# Patient Record
Sex: Female | Born: 1985
Health system: Southern US, Community
[De-identification: ages and names within clinical notes are randomized; demographics above are authoritative.]

## PROBLEM LIST (undated history)

## (undated) DIAGNOSIS — N390 Urinary tract infection, site not specified: Secondary | ICD-10-CM

## (undated) DIAGNOSIS — J45909 Unspecified asthma, uncomplicated: Secondary | ICD-10-CM

## (undated) DIAGNOSIS — E739 Lactose intolerance, unspecified: Secondary | ICD-10-CM

## (undated) HISTORY — DX: Urinary tract infection, site not specified: N39.0

## (undated) HISTORY — DX: Unspecified asthma, uncomplicated: J45.909

## (undated) HISTORY — PX: WISDOM TOOTH EXTRACTION: SHX21

## (undated) HISTORY — DX: Lactose intolerance, unspecified: E73.9

---

## 2012-10-09 LAB — HM PAP SMEAR: HM Pap smear: NORMAL

## 2013-02-23 ENCOUNTER — Ambulatory Visit (INDEPENDENT_AMBULATORY_CARE_PROVIDER_SITE_OTHER): Payer: 59 | Admitting: Family Medicine

## 2013-02-23 VITALS — BP 102/62 | HR 60 | Temp 98.1°F | Resp 18 | Ht 66.0 in | Wt 125.6 lb

## 2013-02-23 DIAGNOSIS — N760 Acute vaginitis: Secondary | ICD-10-CM

## 2013-02-23 DIAGNOSIS — L709 Acne, unspecified: Secondary | ICD-10-CM

## 2013-02-23 DIAGNOSIS — H6092 Unspecified otitis externa, left ear: Secondary | ICD-10-CM

## 2013-02-23 DIAGNOSIS — H60399 Other infective otitis externa, unspecified ear: Secondary | ICD-10-CM

## 2013-02-23 DIAGNOSIS — B9689 Other specified bacterial agents as the cause of diseases classified elsewhere: Secondary | ICD-10-CM

## 2013-02-23 DIAGNOSIS — L708 Other acne: Secondary | ICD-10-CM

## 2013-02-23 MED ORDER — NEOMYCIN-POLYMYXIN-HC 3.5-10000-1 OT SOLN: 3.0000 [drp] | Freq: Four times a day (QID) | OTIC | Status: DC

## 2013-02-23 MED ORDER — ADAPALENE-BENZOYL PEROXIDE 0.1-2.5 % EX GEL: 1.0000 [drp] | Freq: Every day | CUTANEOUS | Status: DC

## 2013-02-23 MED ORDER — METRONIDAZOLE 500 MG PO TABS: 500.0000 mg | ORAL_TABLET | Freq: Two times a day (BID) | ORAL | Status: DC

## 2013-02-23 NOTE — Patient Instructions (Signed)
Use the Cortisporin in your Right ear 3-4 times a day for the next 7 days.  Use the Epiduo daily.   Take the Flagyl twice daily x 7 days.  Let me know if you're not improving with this.   It was good to meet you today!

## 2013-02-23 NOTE — Progress Notes (Signed)
Stacy Saunders is a 27 y.o. female who presents to Urgent Care today with several concerns:  1.  Concern for ear infection:  Fullness and swelling in Right ear x 3 days.  Sounds "stuffy" on that side.  Pain with chewing or talking.  No fevers or chills.  No Q-tips, ear buds/plugs.  Is a nurse and wears stethoscope.  Feels like ear is "swollen."  2.  Acne:  Has been controlled on Epiduo in past.  Present since puberty.  Has been using this nightly for years.  Would like refill.  No acute worsening recently.    3.  Vaginal itching:  Present x 5 days .  Describes itching and burning.  Has history of recurrent bacterial vaginosis, last was 2 months ago.  Steady boyfriend for past several years, monogamous.  Has had yeast infection before in past but this feels like her usual BV.  Thin grayish discharge.   PMH reviewed.  History reviewed. No pertinent past medical history. History reviewed. No pertinent past surgical history.  Medications reviewed. Current Outpatient Prescriptions  Medication Sig Dispense Refill  . Multiple Vitamin (MULTIVITAMIN) tablet Take 1 tablet by mouth daily.       No current facility-administered medications for this visit.    ROS as above otherwise neg.  No chest pain, palpitations, SOB, Fever, Chills, Abd pain, N/V/D.   Physical Exam:  BP 102/62  Pulse 60  Temp(Src) 98.1 F (36.7 C) (Oral)  Resp 18  Ht 5\' 6"  (1.676 m)  Wt 125 lb 9.6 oz (56.972 kg)  BMI 20.28 kg/m2  SpO2 100%  LMP 02/09/2013 Gen:  Alert, cooperative patient who appears stated age in no acute distress.  Vital signs reviewed. Head:  [place]/AT.   Eyes:  PERRL Ears:  Left ear WNL, TM pearly gray.  Right ear with thick discharge in canal, somewhat purulent appearing.  Pain with pulling on tragus.  I cannot visualize TM due to discharge in canal.   Mouth:  MMM, no oropharyngeal erythema.   Neck:  No LAD Gyn:  Patient declines GU exam Skin:  Several scattered closed comedones across bridge of nose and  cheeks.  No others noted Ext:  No LE edema.  Neuro:  No gross deficits noted thoughout Psych:  Pleasant, conversant  Assessment and Plan:  1.  Otitis externa: - cortipsorin to treat - RTC in 2 weeks if no improvement.   - To clean stethoscope buds in between usage with alcohol swabs.   2.  Acne:   - refill for Epiduo  3.  BV: - patient with multiple recurrent episodes of BV - refused pelvic exam - no red flags; can do trial of Flagyl and gauge for improvement.   - to call if worsening.

## 2013-10-08 ENCOUNTER — Telehealth: Payer: Self-pay

## 2013-10-08 NOTE — Telephone Encounter (Signed)
Left message for call back Identifiable   New patient 

## 2013-10-09 ENCOUNTER — Other Ambulatory Visit (HOSPITAL_COMMUNITY)
Admission: RE | Admit: 2013-10-09 | Discharge: 2013-10-09 | Disposition: A | Discharge status: Home / Self Care | Payer: 59 | Source: Ambulatory Visit | Attending: Family Medicine | Admitting: Family Medicine

## 2013-10-09 ENCOUNTER — Ambulatory Visit (INDEPENDENT_AMBULATORY_CARE_PROVIDER_SITE_OTHER): Payer: 59 | Admitting: Family Medicine

## 2013-10-09 ENCOUNTER — Other Ambulatory Visit: Payer: Self-pay | Admitting: Family Medicine

## 2013-10-09 ENCOUNTER — Encounter: Payer: Self-pay | Admitting: Family Medicine

## 2013-10-09 VITALS — BP 106/78 | HR 60 | Temp 98.7°F | Resp 16 | Ht 67.0 in | Wt 134.1 lb

## 2013-10-09 DIAGNOSIS — N898 Other specified noninflammatory disorders of vagina: Secondary | ICD-10-CM | POA: Insufficient documentation

## 2013-10-09 DIAGNOSIS — Z0000 Encounter for general adult medical examination without abnormal findings: Secondary | ICD-10-CM

## 2013-10-09 DIAGNOSIS — N760 Acute vaginitis: Secondary | ICD-10-CM | POA: Insufficient documentation

## 2013-10-09 DIAGNOSIS — J45990 Exercise induced bronchospasm: Secondary | ICD-10-CM

## 2013-10-09 DIAGNOSIS — Z124 Encounter for screening for malignant neoplasm of cervix: Secondary | ICD-10-CM

## 2013-10-09 MED ORDER — ALBUTEROL SULFATE HFA 108 (90 BASE) MCG/ACT IN AERS: 2.0000 | INHALATION_SPRAY | RESPIRATORY_TRACT | Status: DC | PRN

## 2013-10-09 NOTE — Patient Instructions (Signed)
Follow up in 1 year or as needed We'll notify you of your lab results and make any changes if needed We'll call you with your GYN appt to discuss the Implanon and get your pap Call with any questions or concerns Welcome!  We're glad to have you!

## 2013-10-09 NOTE — Assessment & Plan Note (Signed)
Pt's PE WNL w/ exception of vaginal d/c.  Check labs.  Anticipatory guidance provided. Refer to GYN at pt's request for possible implanon insertion and pap.

## 2013-10-09 NOTE — Progress Notes (Signed)
Pre visit review using our clinic review tool, if applicable. No additional management support is needed unless otherwise documented below in the visit note. 

## 2013-10-09 NOTE — Assessment & Plan Note (Signed)
New.  Wet prep collected in order to appropriate direct treatment.

## 2013-10-09 NOTE — Progress Notes (Signed)
   Subjective:    Patient ID: Stacy Saunders, female    DOB: [birth year], 28 y.o.   MRN: [id number]  HPI New to establish.  Previous MD- [facility])  Vaginal d/c- pt has hx of similar, sxs started 'a couple of weeks ago'.  Mild itching, + vaginal discharge- thick and clumpy.  No odor   Review of Systems Patient reports no vision/ hearing changes, adenopathy,fever, weight change,  persistant/recurrent hoarseness , swallowing issues, chest pain, palpitations, edema, persistant/recurrent cough, hemoptysis, dyspnea (rest/exertional/paroxysmal nocturnal), gastrointestinal bleeding (melena, rectal bleeding), abdominal pain, significant heartburn, bowel changes, GU symptoms (dysuria, hematuria, incontinence), Gyn symptoms (abnormal  bleeding, pain),  syncope, focal weakness, memory loss, numbness & tingling, skin/hair/nail changes, abnormal bruising or bleeding, anxiety, or depression.     Objective:   Physical Exam General Appearance:    Alert, cooperative, no distress, appears stated age  Head:    Normocephalic, without obvious abnormality, atraumatic  Eyes:    PERRL, conjunctiva/corneas clear, EOM's intact, fundi    benign, both eyes  Ears:    Normal TM's and external ear canals, both ears  Nose:   Nares normal, septum midline, mucosa normal, no drainage    or sinus tenderness  Throat:   Lips, mucosa, and tongue normal; teeth and gums normal  Neck:   Supple, symmetrical, trachea midline, no adenopathy;    Thyroid: no enlargement/tenderness/nodules  Back:     Symmetric, no curvature, ROM normal, no CVA tenderness  Lungs:     Clear to auscultation bilaterally, respirations unlabored  Chest Wall:    No tenderness or deformity   Heart:    Regular rate and rhythm, S1 and S2 normal, no murmur, rub   or gallop  Breast Exam:    Deferred to GYN  Abdomen:     Soft, non-tender, bowel sounds active all four quadrants,    no masses, no organomegaly  Genitalia:    Thick, white,  clumpy vaginal discharge visible externally, wet prep collected  Rectal:    Extremities:   Extremities normal, atraumatic, no cyanosis or edema  Pulses:   2+ and symmetric all extremities  Skin:   Skin color, texture, turgor normal, no rashes or lesions  Lymph nodes:   Cervical, supraclavicular, and axillary nodes normal  Neurologic:   CNII-XII intact, normal strength, sensation and reflexes    throughout          Assessment & Plan:

## 2013-10-09 NOTE — Assessment & Plan Note (Signed)
New to provider.  Prescription for Proair given to have on hand in case pt needs this.

## 2013-10-10 LAB — VITAMIN D 25 HYDROXY (VIT D DEFICIENCY, FRACTURES): Vit D, 25-Hydroxy: 25 ng/mL — ABNORMAL LOW (ref 30–89)

## 2013-10-12 ENCOUNTER — Encounter: Payer: Self-pay | Admitting: General Practice

## 2013-10-12 NOTE — Telephone Encounter (Signed)
Unable to reach pre visit.  

## 2013-10-13 ENCOUNTER — Other Ambulatory Visit: Payer: Self-pay | Admitting: General Practice

## 2013-10-13 MED ORDER — METRONIDAZOLE 500 MG PO TABS: 500.0000 mg | ORAL_TABLET | Freq: Two times a day (BID) | ORAL | Status: DC

## 2013-10-14 ENCOUNTER — Encounter: Payer: Self-pay | Admitting: General Practice

## 2013-10-14 LAB — LIPID PANEL
CHOLESTEROL: 144 mg/dL (ref 0–200)
HDL: 67 mg/dL (ref >39)
LDL Cholesterol: 66 mg/dL (ref 0–99)
Total CHOL/HDL Ratio: 2.1 Ratio
Triglycerides: 54 mg/dL (ref <150)
VLDL: 11 mg/dL (ref 0–40)

## 2013-10-14 LAB — BASIC METABOLIC PANEL
BUN: 9 mg/dL (ref 6–23)
CALCIUM: 9.8 mg/dL (ref 8.4–10.5)
CO2: 24 mEq/L (ref 19–32)
Chloride: 105 mEq/L (ref 96–112)
Creat: 0.87 mg/dL (ref 0.50–1.10)
GLUCOSE: 66 mg/dL — AB (ref 70–99)
Potassium: 4.2 mEq/L (ref 3.5–5.3)
Sodium: 139 mEq/L (ref 135–145)

## 2013-10-14 LAB — HEPATIC FUNCTION PANEL
ALT: 13 U/L (ref 0–35)
AST: 16 U/L (ref 0–37)
Albumin: 4.1 g/dL (ref 3.5–5.2)
Alkaline Phosphatase: 44 U/L (ref 39–117)
BILIRUBIN DIRECT: 0.1 mg/dL (ref 0.0–0.3)
BILIRUBIN INDIRECT: 0.2 mg/dL (ref 0.2–1.2)
Total Bilirubin: 0.3 mg/dL (ref 0.2–1.2)
Total Protein: 6.7 g/dL (ref 6.0–8.3)

## 2013-10-14 LAB — TSH: TSH: 2.832 u[IU]/mL (ref 0.350–4.500)

## 2014-06-10 ENCOUNTER — Encounter (HOSPITAL_COMMUNITY): Payer: Self-pay | Admitting: Emergency Medicine

## 2014-06-10 ENCOUNTER — Emergency Department (HOSPITAL_COMMUNITY)
Admission: EM | Admit: 2014-06-10 | Discharge: 2014-06-10 | Disposition: A | Discharge status: Home / Self Care | Payer: 59 | Source: Home / Self Care | Attending: Family Medicine | Admitting: Family Medicine

## 2014-06-10 ENCOUNTER — Telehealth: Payer: Self-pay | Admitting: *Deleted

## 2014-06-10 ENCOUNTER — Ambulatory Visit: Payer: 59 | Admitting: Family Medicine

## 2014-06-10 DIAGNOSIS — J069 Acute upper respiratory infection, unspecified: Secondary | ICD-10-CM

## 2014-06-10 DIAGNOSIS — J029 Acute pharyngitis, unspecified: Secondary | ICD-10-CM

## 2014-06-10 LAB — POCT RAPID STREP A: Streptococcus, Group A Screen (Direct): NEGATIVE

## 2014-06-10 MED ORDER — ACETAMINOPHEN 325 MG PO TABS
ORAL_TABLET | ORAL | Status: AC
  Filled 2014-06-10: qty 2

## 2014-06-10 MED ORDER — ACETAMINOPHEN 325 MG PO TABS
650.0000 mg | ORAL_TABLET | Freq: Once | ORAL | Status: AC
  Administered 2014-06-10: 650 mg via ORAL

## 2014-06-10 NOTE — Discharge Instructions (Signed)
Rapid strep test was negative. Exam suggestions a common cold with associated viral pharyngitis (sore throat). Warm salt water gargles and tylenol as directed on packaging for discomfort. Throat swab will be sent for 3 day culture and if results indicate the need for additional treatment you will be notified by phone. Expect improvement over the next 5-6 days.  Pharyngitis Pharyngitis is redness, pain, and swelling (inflammation) of your pharynx.  CAUSES  Pharyngitis is usually caused by infection. Most of the time, these infections are from viruses (viral) and are part of a cold. However, sometimes pharyngitis is caused by bacteria (bacterial). Pharyngitis can also be caused by allergies. Viral pharyngitis may be spread from person to person by coughing, sneezing, and personal items or utensils (cups, forks, spoons, toothbrushes). Bacterial pharyngitis may be spread from person to person by more intimate contact, such as kissing.  SIGNS AND SYMPTOMS  Symptoms of pharyngitis include:   Sore throat.   Tiredness (fatigue).   Low-grade fever.   Headache.  Joint pain and muscle aches.  Skin rashes.  Swollen lymph nodes.  Plaque-like film on throat or tonsils (often seen with bacterial pharyngitis). DIAGNOSIS  Your health care provider will ask you questions about your illness and your symptoms. Your medical history, along with a physical exam, is often all that is needed to diagnose pharyngitis. Sometimes, a rapid strep test is done. Other lab tests may also be done, depending on the suspected cause.  TREATMENT  Viral pharyngitis will usually get better in 3-4 days without the use of medicine. Bacterial pharyngitis is treated with medicines that kill germs (antibiotics).  HOME CARE INSTRUCTIONS   Drink enough water and fluids to keep your urine clear or pale yellow.   Only take over-the-counter or prescription medicines as directed by your health care provider:   If you are  prescribed antibiotics, make sure you finish them even if you start to feel better.   Do not take aspirin.   Get lots of rest.   Gargle with 8 oz of salt water ( tsp of salt per 1 qt of water) as often as every 1-2 hours to soothe your throat.   Throat lozenges (if you are not at risk for choking) or sprays may be used to soothe your throat. SEEK MEDICAL CARE IF:   You have large, tender lumps in your neck.  You have a rash.  You cough up green, yellow-brown, or bloody spit. SEEK IMMEDIATE MEDICAL CARE IF:   Your neck becomes stiff.  You drool or are unable to swallow liquids.  You vomit or are unable to keep medicines or liquids down.  You have severe pain that does not go away with the use of recommended medicines.  You have trouble breathing (not caused by a stuffy nose). MAKE SURE YOU:   Understand these instructions.  Will watch your condition.  Will get help right away if you are not doing well or get worse. Document Released: 04/30/2005 Document Revised: 02/18/2013 Document Reviewed: 01/05/2013 [employer] Patient Information 2015 [place], [place]. This information is not intended to replace advice given to you by your health care provider. Make sure you discuss any questions you have with your health care provider.  Salt Water Gargle This solution will help make your mouth and throat feel better. HOME CARE INSTRUCTIONS   Mix 1 teaspoon of salt in 8 ounces of warm water.  Gargle with this solution as much or often as you need or as directed. Swish and gargle gently  if you have any sores or wounds in your mouth.  Do not swallow this mixture. Document Released: 02/02/2004 Document Revised: 07/23/2011 Document Reviewed: 06/25/2008 [facility] Patient Information 2015 [place], [place]. This information is not intended to replace advice given to you by your health care provider. Make sure you discuss any questions you have with your health care provider.  Strep Throat  Tests While most sore throats are caused by viruses, at times they are caused by a bacteria called group A Streptococci (strep throat). It is important to determine the cause because the strep bacteria is treated with antibiotic medication. There are 2 types of tests for strep throat: a rapid strep test and a throat culture. Both tests are done by wiping a swab over the back of the throat and then using chemicals to identify the type of bacteria present. The rapid strep test takes 10 to 20 minutes. If the rapid strep test is negative, a throat culture may be performed to confirm the results. With a throat culture, the swab is used to spread the bacteria on a gel plate and grow it in a lab, which may take 1 to 2 days. In some cases, the culture will detect strep bacteria not found with the rapid strep test. If the result of the rapid strep test is positive, no further testing is needed, and your caregiver will prescribe antibiotics. Not all test results are available during your visit. If your test results are not back during the visit, make an appointment with your caregiver to find out the results. Do not assume everything is normal if you have not heard from your caregiver or the medical facility. It is important for you to follow up on all of your test results. SEEK MEDICAL CARE IF:   Your symptoms are not improving within 1 to 2 days, or you are getting worse.  You have any other questions or concerns. SEEK IMMEDIATE MEDICAL CARE IF:   You have increased difficulty with swallowing.  You develop trouble breathing.  You have a fever. Document Released: 06/07/2004 Document Revised: 07/23/2011 Document Reviewed: 08/05/2013 [facility] Patient Information 2015 [place], [place]. This information is not intended to replace advice given to you by your health care provider. Make sure you discuss any questions you have with your health care provider.  Sore Throat A sore throat is pain, burning, irritation,  or scratchiness of the throat. There is often pain or tenderness when swallowing or talking. A sore throat may be accompanied by other symptoms, such as coughing, sneezing, fever, and swollen neck glands. A sore throat is often the first sign of another sickness, such as a cold, flu, strep throat, or mononucleosis (commonly known as mono). Most sore throats go away without medical treatment. CAUSES  The most common causes of a sore throat include:  A viral infection, such as a cold, flu, or mono.  A bacterial infection, such as strep throat, tonsillitis, or whooping cough.  Seasonal allergies.  Dryness in the air.  Irritants, such as smoke or pollution.  Gastroesophageal reflux disease (GERD). HOME CARE INSTRUCTIONS   Only take over-the-counter medicines as directed by your caregiver.  Drink enough fluids to keep your urine clear or pale yellow.  Rest as needed.  Try using throat sprays, lozenges, or sucking on hard candy to ease any pain (if older than 4 years or as directed).  Sip warm liquids, such as broth, herbal tea, or warm water with honey to relieve pain temporarily. You may also eat  or drink cold or frozen liquids such as frozen ice pops.  Gargle with salt water (mix 1 tsp salt with 8 oz of water).  Do not smoke and avoid secondhand smoke.  Put a cool-mist humidifier in your bedroom at night to moisten the air. You can also turn on a hot shower and sit in the bathroom with the door closed for 5-10 minutes. SEEK IMMEDIATE MEDICAL CARE IF:  You have difficulty breathing.  You are unable to swallow fluids, soft foods, or your saliva.  You have increased swelling in the throat.  Your sore throat does not get better in 7 days.  You have nausea and vomiting.  You have a fever or persistent symptoms for more than 2-3 days.  You have a fever and your symptoms suddenly get worse. MAKE SURE YOU:   Understand these instructions.  Will watch your condition.  Will  get help right away if you are not doing well or get worse. Document Released: 06/07/2004 Document Revised: 04/16/2012 Document Reviewed: 01/06/2012 [facility] Patient Information 2015 [place], [place]. This information is not intended to replace advice given to you by your health care provider. Make sure you discuss any questions you have with your health care provider.  Upper Respiratory Infection, Adult An upper respiratory infection (URI) is also sometimes known as the common cold. The upper respiratory tract includes the nose, sinuses, throat, trachea, and bronchi. Bronchi are the airways leading to the lungs. Most people improve within 1 week, but symptoms can last up to 2 weeks. A residual cough may last even longer.  CAUSES Many different viruses can infect the tissues lining the upper respiratory tract. The tissues become irritated and inflamed and often become very moist. Mucus production is also common. A cold is contagious. You can easily spread the virus to others by oral contact. This includes kissing, sharing a glass, coughing, or sneezing. Touching your mouth or nose and then touching a surface, which is then touched by another person, can also spread the virus. SYMPTOMS  Symptoms typically develop 1 to 3 days after you come in contact with a cold virus. Symptoms vary from person to person. They may include:  Runny nose.  Sneezing.  Nasal congestion.  Sinus irritation.  Sore throat.  Loss of voice (laryngitis).  Cough.  Fatigue.  Muscle aches.  Loss of appetite.  Headache.  Low-grade fever. DIAGNOSIS  You might diagnose your own cold based on familiar symptoms, since most people get a cold 2 to 3 times a year. Your caregiver can confirm this based on your exam. Most importantly, your caregiver can check that your symptoms are not due to another disease such as strep throat, sinusitis, pneumonia, asthma, or epiglottitis. Blood tests, throat tests, and X-rays are not  necessary to diagnose a common cold, but they may sometimes be helpful in excluding other more serious diseases. Your caregiver will decide if any further tests are required. RISKS AND COMPLICATIONS  You may be at risk for a more severe case of the common cold if you smoke cigarettes, have chronic heart disease (such as heart failure) or lung disease (such as asthma), or if you have a weakened immune system. The very young and very old are also at risk for more serious infections. Bacterial sinusitis, middle ear infections, and bacterial pneumonia can complicate the common cold. The common cold can worsen asthma and chronic obstructive pulmonary disease (COPD). Sometimes, these complications can require emergency medical care and may be life-threatening. PREVENTION  The  best way to protect against getting a cold is to practice good hygiene. Avoid oral or hand contact with people with cold symptoms. Wash your hands often if contact occurs. There is no clear evidence that vitamin C, vitamin E, echinacea, or exercise reduces the chance of developing a cold. However, it is always recommended to get plenty of rest and practice good nutrition. TREATMENT  Treatment is directed at relieving symptoms. There is no cure. Antibiotics are not effective, because the infection is caused by a virus, not by bacteria. Treatment may include:  Increased fluid intake. Sports drinks offer valuable electrolytes, sugars, and fluids.  Breathing heated mist or steam (vaporizer or shower).  Eating chicken soup or other clear broths, and maintaining good nutrition.  Getting plenty of rest.  Using gargles or lozenges for comfort.  Controlling fevers with ibuprofen or acetaminophen as directed by your caregiver.  Increasing usage of your inhaler if you have asthma. Zinc gel and zinc lozenges, taken in the first 24 hours of the common cold, can shorten the duration and lessen the severity of symptoms. Pain medicines may help  with fever, muscle aches, and throat pain. A variety of non-prescription medicines are available to treat congestion and runny nose. Your caregiver can make recommendations and may suggest nasal or lung inhalers for other symptoms.  HOME CARE INSTRUCTIONS   Only take over-the-counter or prescription medicines for pain, discomfort, or fever as directed by your caregiver.  Use a warm mist humidifier or inhale steam from a shower to increase air moisture. This may keep secretions moist and make it easier to breathe.  Drink enough water and fluids to keep your urine clear or pale yellow.  Rest as needed.  Return to work when your temperature has returned to normal or as your caregiver advises. You may need to stay home longer to avoid infecting others. You can also use a face mask and careful hand washing to prevent spread of the virus. SEEK MEDICAL CARE IF:   After the first few days, you feel you are getting worse rather than better.  You need your caregiver's advice about medicines to control symptoms.  You develop chills, worsening shortness of breath, or brown or red sputum. These may be signs of pneumonia.  You develop yellow or brown nasal discharge or pain in the face, especially when you bend forward. These may be signs of sinusitis.  You develop a fever, swollen neck glands, pain with swallowing, or white areas in the back of your throat. These may be signs of strep throat. SEEK IMMEDIATE MEDICAL CARE IF:   You have a fever.  You develop severe or persistent headache, ear pain, sinus pain, or chest pain.  You develop wheezing, a prolonged cough, cough up blood, or have a change in your usual mucus (if you have chronic lung disease).  You develop sore muscles or a stiff neck. Document Released: 10/24/2000 Document Revised: 07/23/2011 Document Reviewed: 08/05/2013 [facility] Patient Information 2015 [place], [place]. This information is not intended to replace advice given to you by  your health care provider. Make sure you discuss any questions you have with your health care provider.

## 2014-06-10 NOTE — ED Notes (Signed)
Reports onset of symptoms Sunday 1/24.  Initially had sneezing, runny nose, cough and headache.  Onset of sore throat 1/27.

## 2014-06-10 NOTE — Telephone Encounter (Signed)
Pt was going to be late, called and let us know and decided to go to urgent care.  Charge no show fee?

## 2014-06-10 NOTE — Telephone Encounter (Signed)
No fee b/c pt called

## 2014-06-10 NOTE — ED Provider Notes (Signed)
CSN: [id number]     Arrival date & time 06/10/14  0846 History   First MD Initiated Contact with Patient 06/10/14 0900     Chief Complaint  Patient presents with  . Sore Throat   (Consider location/radiation/quality/duration/timing/severity/associated sxs/prior Treatment) Patient is a 29 y.o. female presenting with URI. The history is provided by the patient.  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 Asthma     exercise induced  . UTI (lower urinary tract infection)    History reviewed. No pertinent past surgical history. Family History  Problem Relation Age of Onset  . Hypertension Mother   . Hyperlipidemia Mother   . Glaucoma Father   . Aneurysm Maternal Grandmother    History  Substance Use Topics  . Smoking status: Never Smoker   . Smokeless tobacco: Not on file  . Alcohol Use: Yes   OB History    No data available     Review of Systems  Constitutional: Negative for fever, chills and fatigue.  HENT: Positive for congestion, rhinorrhea, sneezing and sore throat. Negative for ear pain and mouth sores.   Eyes: Negative.   Respiratory: Positive for cough. Negative for chest tightness, shortness of breath and wheezing.   Cardiovascular: Negative.   Gastrointestinal: Negative.   Musculoskeletal: Negative.   Skin: Negative.     Allergies  Review of patient's allergies indicates no known allergies.  Home Medications   Prior to Admission medications   Medication Sig Start Date End Date Taking? Authorizing Provider  Adapalene-Benzoyl Peroxide 0.1-2.5 % gel  Apply 1 drop topically at bedtime. 02/23/13   Tobey Grim, MD  albuterol [facility] HFA) 108 (90 BASE) MCG/ACT inhaler Inhale 2 puffs into the lungs every 4 (four) hours as needed for wheezing or shortness of breath. 10/09/13   Sheliah Hatch, MD  metroNIDAZOLE (FLAGYL) 500 MG tablet Take 1 tablet (500 mg total) by mouth 2 (two) times daily. 10/13/13   Sheliah Hatch, MD  Multiple Vitamin (MULTIVITAMIN) tablet Take 1 tablet by mouth daily.    Historical Provider, MD   BP 121/69 mmHg  Pulse 68  Temp(Src) 98.4 F (36.9 C) (Oral)  Resp 14  SpO2 99%  LMP 06/09/2014 Physical Exam  Constitutional: She is oriented to person, place, and time. She appears well-developed and well-nourished. No distress.  HENT:  Head: Normocephalic and atraumatic.  Right Ear: Hearing, tympanic membrane, external ear and ear canal normal.  Left Ear: Hearing, external ear and ear canal normal.  Nose: Nose normal.  Mouth/Throat: Uvula is midline and mucous membranes are normal. Posterior oropharyngeal erythema present.    Left TM obstructed by cerumen  Eyes: Conjunctivae are normal. No scleral icterus.  Neck: Normal range of motion. Neck supple.  Cardiovascular: Normal rate, regular rhythm and normal heart sounds.   Pulmonary/Chest: Effort normal and breath sounds normal. No stridor.  Musculoskeletal: Normal range of motion.  Lymphadenopathy:    She has no cervical adenopathy.  Neurological: She is alert and oriented to person, place, and time.  Nursing note and vitals reviewed.   ED Course  Procedures (including critical care time) Labs Review Labs Reviewed  POCT RAPID STREP A ([facility] CARE ONLY)    Imaging Review No results found.   MDM   1. URI (upper respiratory infection)   2. Pharyngitis    Rapid strep negative.  Exam suggestions a common cold with associated viral pharyngitis (sore throat). Warm salt water gargles and tylenol as directed on packaging for discomfort. Throat swab will  be sent for 3 day culture and if results indicate the need for additional treatment you will be notified by phone. Expect improvement over the next 5-6 days.   Mathis FareJennifer Lee H EvansPresson, GeorgiaPA 06/10/14 [phone]

## 2014-06-10 NOTE — Telephone Encounter (Signed)
See note below

## 2014-06-11 ENCOUNTER — Encounter: Payer: Self-pay | Admitting: Medical

## 2014-06-11 ENCOUNTER — Ambulatory Visit (INDEPENDENT_AMBULATORY_CARE_PROVIDER_SITE_OTHER): Payer: 59 | Admitting: Medical

## 2014-06-11 VITALS — BP 114/59 | HR 56 | Temp 97.9°F | Ht 67.0 in | Wt 132.8 lb

## 2014-06-11 DIAGNOSIS — J029 Acute pharyngitis, unspecified: Secondary | ICD-10-CM

## 2014-06-11 MED ORDER — AZITHROMYCIN 250 MG PO TABS: ORAL_TABLET | ORAL | Status: DC

## 2014-06-11 NOTE — Progress Notes (Signed)
Pre visit review using our clinic review tool, if applicable. No additional management support is needed unless otherwise documented below in the visit note. 

## 2014-06-11 NOTE — Progress Notes (Signed)
   Subjective:    Patient ID: Stacy Saunders, female    DOB: 1986-03-10, 29 y.o.   MRN: [id number]  HPI  Pt has sore throat for  2 days.  Associated symptom.  Body aches-no Fever- maybe the other night Chills-no HA-yes. Mild  Neck symptoms-no Lymph node enlargement-rt submandibular node tender. Rash-No Painful swallowing-hurts to swallow anything even her own saliva. Recent strep contact-works as Charity fundraiserN.  Mild congestion cough and congestion.  Pt went to urgent care yesterday. Rapid strep neg. Pt not better. They thought just viral.  LMP- 06-08-2014.     Review of Systems  Constitutional: Positive for fever.  HENT: Positive for congestion and sore throat.        Mild congestion.  But moderate to severe cough.  Respiratory: Positive for cough.        Rare and minimal.  Gastrointestinal: Negative for abdominal pain.  Musculoskeletal: Negative for back pain and neck pain.  Skin: Negative for rash.  Neurological: Positive for headaches.  Hematological: Positive for adenopathy. Does not bruise/bleed easily.   Past Medical History  Diagnosis Date  . Asthma     exercise induced  . UTI (lower urinary tract infection)     History   Social History  . Marital Status: Single    Spouse Name: N/A    Number of Children: N/A  . Years of Education: N/A   Occupational History  . Not on file.   Social History Main Topics  . Smoking status: Never Smoker   . Smokeless tobacco: Not on file  . Alcohol Use: Yes  . Drug Use: No  . Sexual Activity: Yes   Other Topics Concern  . Not on file   Social History Narrative    No past surgical history on file.  Family History  Problem Relation Age of Onset  . Hypertension Mother   . Hyperlipidemia Mother   . Glaucoma Father   . Aneurysm Maternal Grandmother     No Known Allergies  Current Outpatient Prescriptions on File Prior to Visit  Medication Sig Dispense Refill  . Adapalene-Benzoyl Peroxide 0.1-2.5 % gel Apply 1  drop topically at bedtime. 45 g 2  . albuterol (PROAIR HFA) 108 (90 BASE) MCG/ACT inhaler Inhale 2 puffs into the lungs every 4 (four) hours as needed for wheezing or shortness of breath. (Patient not taking: Reported on 06/11/2014) 1 Inhaler 6  . metroNIDAZOLE (FLAGYL) 500 MG tablet Take 1 tablet (500 mg total) by mouth 2 (two) times daily. 14 tablet 0  . Multiple Vitamin (MULTIVITAMIN) tablet Take 1 tablet by mouth daily.     No current facility-administered medications on file prior to visit.    BP 114/59 mmHg  Pulse 56  Temp(Src) 97.9 F (36.6 C) (Oral)  Ht 5\' 7"  (1.702 m)  Wt 132 lb 12.8 oz (60.238 kg)  BMI 20.79 kg/m2  SpO2 100%  LMP 06/06/2014      Objective:   Physical Exam  General- No acute distress, pleasant pt.  Neck- from, No nuccal rigidity, Mild submandibular node hypertrophy.  Lungs- Clear even and unlabored.  Heart- Regular, rate and rhythm. HEENT- Head- normocephalic Eyes- PEERL bilaterally. Ears- Canals clear, normal tm's bilaterally. Nose- No frontal or maxillary sinus tenderness to palpation. Turbinates normal. Throat- posterior pharynx shows  1+  tonsillar hypertrophy plus,  Moderate erythma,  discharge.   Neurologic- CN III- XII grossly intact.     Assessment & Plan:

## 2014-06-11 NOTE — Assessment & Plan Note (Signed)
Your strep test was negative. However, your physical exam and clinical presentation is suspicious for strep and it is important to note that rapid strep test can be falsely negative. So I am going to give you azithromycin antibiotic today based on your exam and clinical presentation. 

## 2014-06-11 NOTE — Patient Instructions (Signed)
Your strep test was negative. However, your physical exam and clinical presentation is suspicious for strep and it is important to note that rapid strep test can be falsely negative. So I am going to give you azithromycin  antibiotic today based on your exam and clinical presentation.  Rest hydrate, tylenol for fever, and warm salt water gargles.   Follow up in 7 days or as needed.    

## 2014-06-12 LAB — CULTURE, GROUP A STREP

## 2014-06-15 ENCOUNTER — Encounter: Payer: Self-pay | Admitting: Medical

## 2014-06-15 ENCOUNTER — Ambulatory Visit (INDEPENDENT_AMBULATORY_CARE_PROVIDER_SITE_OTHER): Payer: 59 | Admitting: Medical

## 2014-06-15 VITALS — BP 129/68 | HR 58 | Temp 98.1°F | Resp 16 | Wt 132.0 lb

## 2014-06-15 DIAGNOSIS — R591 Generalized enlarged lymph nodes: Secondary | ICD-10-CM

## 2014-06-15 LAB — CBC WITH DIFFERENTIAL/PLATELET
BASOS PCT: 0.6 % (ref 0.0–3.0)
Basophils Absolute: 0 10*3/uL (ref 0.0–0.1)
Eosinophils Absolute: 0.1 10*3/uL (ref 0.0–0.7)
Eosinophils Relative: 2.9 % (ref 0.0–5.0)
HCT: 43 % (ref 36.0–46.0)
Hemoglobin: 14.5 g/dL (ref 12.0–15.0)
Lymphocytes Relative: 53.2 % — ABNORMAL HIGH (ref 12.0–46.0)
Lymphs Abs: 2.1 10*3/uL (ref 0.7–4.0)
MCHC: 33.7 g/dL (ref 30.0–36.0)
MCV: 90.3 fl (ref 78.0–100.0)
MONO ABS: 0.3 10*3/uL (ref 0.1–1.0)
Monocytes Relative: 6.9 % (ref 3.0–12.0)
NEUTROS PCT: 36.4 % — AB (ref 43.0–77.0)
Neutro Abs: 1.5 10*3/uL (ref 1.4–7.7)
Platelets: 255 10*3/uL (ref 150.0–400.0)
RBC: 4.76 Mil/uL (ref 3.87–5.11)
RDW: 13.1 % (ref 11.5–15.5)
WBC: 4 10*3/uL (ref 4.0–10.5)

## 2014-06-15 NOTE — Patient Instructions (Addendum)
Your recent strep test was negative at urgent care. You partially failed tx with antibiotic in that you have rt submandibular node that is swollen and quite painful.   I will get a cbc today and go ahead and refer you to ENT.  Follow up her as needed if new signs or symptoms occur prior to ENT evaluation.  Also please be aware lymph node may have been reactive and may subside prior to ENT appointment.

## 2014-06-15 NOTE — Progress Notes (Signed)
Subjective:    Patient ID: Stacy Saunders, female    DOB: 03/13/1986, 29 y.o.   MRN: [id number]  HPI   Pt in for st. This is last day of her antibiotic. I gave her azithromycin for possible. She came in on Friday. Pt saw UC day before me and her rapid test was negative. No fever, no chills or sweats. She has had some runny nose, sneezing and some post nasal drainage recently. She thinks this stopped yesterday.  Pt clarifies that last time her entire throat hurt but now pain consolidated in lymph node/below jaw region.  Pt states history of some frequent sore throats during elementary school.   Pt pain level 3/10. Yesterday level 7/10.   Review of Systems  Constitutional: Negative for fever, chills and fatigue.  HENT: Negative for congestion, ear discharge, ear pain, hearing loss, mouth sores, nosebleeds, postnasal drip, sore throat and tinnitus.        St mostly resolved now. Pain mostly now just in rt side neck lymph node.  Respiratory: Negative for cough, choking and wheezing.   Cardiovascular: Negative for chest pain and palpitations.  Gastrointestinal: Negative for abdominal pain.  Musculoskeletal: Negative for back pain.  Neurological: Negative for headaches.  Hematological: Positive for adenopathy. Does not bruise/bleed easily.    Past Medical History  Diagnosis Date  . Asthma     exercise induced  . UTI (lower urinary tract infection)     History   Social History  . Marital Status: Single    Spouse Name: N/A    Number of Children: N/A  . Years of Education: N/A   Occupational History  . Not on file.   Social History Main Topics  . Smoking status: Never Smoker   . Smokeless tobacco: Not on file  . Alcohol Use: Yes  . Drug Use: No  . Sexual Activity: Yes   Other Topics Concern  . Not on file   Social History Narrative    History reviewed. No pertinent past surgical history.  Family History  Problem Relation Age of Onset  . Hypertension Mother     . Hyperlipidemia Mother   . Glaucoma Father   . Aneurysm Maternal Grandmother     No Known Allergies  Current Outpatient Prescriptions on File Prior to Visit  Medication Sig Dispense Refill  . Adapalene-Benzoyl Peroxide 0.1-2.5 % gel Apply 1 drop topically at bedtime. 45 g 2  . azithromycin (ZITHROMAX) 250 MG tablet Take 2 tablets by mouth on day 1, followed by 1 tablet by mouth daily for 4 days. 6 tablet 0  . metroNIDAZOLE (FLAGYL) 500 MG tablet Take 1 tablet (500 mg total) by mouth 2 (two) times daily. 14 tablet 0  . Multiple Vitamin (MULTIVITAMIN) tablet Take 1 tablet by mouth daily.    Marland Kitchen albuterol (PROAIR HFA) 108 (90 BASE) MCG/ACT inhaler Inhale 2 puffs into the lungs every 4 (four) hours as needed for wheezing or shortness of breath. (Patient not taking: Reported on 06/15/2014) 1 Inhaler 6   No current facility-administered medications on file prior to visit.    BP 129/68 mmHg  Pulse 58  Temp(Src) 98.1 F (36.7 C) (Oral)  Resp 16  Wt 132 lb (59.875 kg)  SpO2 98%  LMP 06/06/2014       Objective:   Physical Exam  General- No acute distress, pleasant pt.  Neck- from, No nuccal rigidity, Mild submandibular node hypertrophy.(Rt side and mild tender to palpation)  Lungs- Clear even and unlabored.  Heart- Regular, rate and rhythm. HEENT- Head- normocephalic Eyes- PEERL bilaterally. Ears- Canals clear, normal tm's bilaterally. Nose- No frontal or maxillary sinus tenderness to palpation. Turbinates normal. Throat- posterior pharynx shows  No  tonsillar,  No erythma,   No discharge.   Neurologic- CN III- XII grossly intact.      Assessment & Plan:

## 2014-06-15 NOTE — Assessment & Plan Note (Signed)
Your recent strep test was negative at urgent care. You partially failed tx with antibiotic in that you have rt submandibular node that is swollen and quite painful.   I will get a cbc today and go ahead and refer you to ENT.  Follow up her as needed if new signs or symptoms occur prior to ENT evaluation.  Also please be aware lymph node may have been reactive and may subside prior to ENT appointment. 

## 2014-06-15 NOTE — Progress Notes (Signed)
Pre visit review using our clinic review tool, if applicable. No additional management support is needed unless otherwise documented below in the visit note. 

## 2014-06-21 ENCOUNTER — Telehealth: Payer: Self-pay | Admitting: General Practice

## 2014-06-21 MED ORDER — ADAPALENE-BENZOYL PEROXIDE 0.1-2.5 % EX GEL: 1.0000 [drp] | Freq: Every day | CUTANEOUS | Status: DC

## 2014-06-21 NOTE — Telephone Encounter (Signed)
Caller name: Herbert Punddison, Anya Y Relation to pt: self  Call back number: [phone] Pharmacy: [id number] [place]- , [place] - [address] [phone] (Phone) [phone] (Fax)    Reason for call:  Pt calling to check on the status of medication refill Adapalene-Benzoyl Peroxide 0.1-2.5 % gel

## 2014-06-21 NOTE — Telephone Encounter (Signed)
Med filled.  

## 2014-06-21 NOTE — Telephone Encounter (Signed)
Ok for refill? 

## 2014-06-21 NOTE — Telephone Encounter (Signed)
Last OV 10-09-13 Pt is requesting refill of epi-duo last dispensed with Qty of 45. You have never prescribed this medication. Please advise?

## 2015-06-17 DIAGNOSIS — L219 Seborrheic dermatitis, unspecified: Secondary | ICD-10-CM | POA: Diagnosis not present

## 2015-06-17 DIAGNOSIS — L700 Acne vulgaris: Secondary | ICD-10-CM | POA: Diagnosis not present

## 2015-07-13 ENCOUNTER — Ambulatory Visit (INDEPENDENT_AMBULATORY_CARE_PROVIDER_SITE_OTHER): Payer: 59 | Admitting: Medical

## 2015-07-13 ENCOUNTER — Encounter: Payer: Self-pay | Admitting: Medical

## 2015-07-13 VITALS — BP 104/60 | HR 59 | Temp 98.0°F

## 2015-07-13 DIAGNOSIS — H6692 Otitis media, unspecified, left ear: Secondary | ICD-10-CM

## 2015-07-13 DIAGNOSIS — H60392 Other infective otitis externa, left ear: Secondary | ICD-10-CM

## 2015-07-13 MED ORDER — FLUCONAZOLE 150 MG PO TABS: 150.0000 mg | ORAL_TABLET | Freq: Once | ORAL | Status: DC

## 2015-07-13 MED ORDER — NEOMYCIN-POLYMYXIN-HC 1 % OT SOLN: 3.0000 [drp] | Freq: Four times a day (QID) | OTIC | Status: DC

## 2015-07-13 MED ORDER — AMOXICILLIN-POT CLAVULANATE 875-125 MG PO TABS: 1.0000 | ORAL_TABLET | Freq: Two times a day (BID) | ORAL | Status: DC

## 2015-07-13 NOTE — Progress Notes (Signed)
Pre visit review using our clinic review tool, if applicable. No additional management support is needed unless otherwise documented below in the visit note. 

## 2015-07-13 NOTE — Progress Notes (Signed)
Subjective:    Patient ID: Stacy Saunders, female    DOB: 03-02-1986, 30 y.o.   MRN: [id number]  HPI  Pt in with some left ear pain. Pain since Tuesday. Pt has hx of ear infections as child and adult. About 3 ear infections a year. She states often no preceding upper respiratory or allergy symptoms. She denies any such symptoms recently. Pain in her ear is keeping her up and night.   LMP- July 01, 2015.   Review of Systems  Constitutional: Negative for fever, chills and fatigue.  HENT: Positive for ear pain. Negative for congestion, nosebleeds, postnasal drip, rhinorrhea, sinus pressure and sneezing.   Respiratory: Negative for apnea, cough, shortness of breath and wheezing.   Cardiovascular: Negative for chest pain and palpitations.  Musculoskeletal: Negative for back pain.  Hematological: Positive for adenopathy.       Pt states sometimes with ear infection. Left side lymph node will enlarge.  Psychiatric/Behavioral: Negative for behavioral problems and confusion.   Past Medical History  Diagnosis Date  . Asthma     exercise induced  . UTI (lower urinary tract infection)     Social History   Social History  . Marital Status: Single    Spouse Name: N/A  . Number of Children: N/A  . Years of Education: N/A   Occupational History  . Not on file.   Social History Main Topics  . Smoking status: Never Smoker   . Smokeless tobacco: Not on file  . Alcohol Use: Yes  . Drug Use: No  . Sexual Activity: Yes   Other Topics Concern  . Not on file   Social History Narrative    No past surgical history on file.  Family History  Problem Relation Age of Onset  . Hypertension Mother   . Hyperlipidemia Mother   . Glaucoma Father   . Aneurysm Maternal Grandmother     No Known Allergies  Current Outpatient Prescriptions on File Prior to Visit  Medication Sig Dispense Refill  . Adapalene-Benzoyl Peroxide 0.1-2.5 % gel Apply 1 drop topically at bedtime. 45 g 2  .  albuterol (PROAIR HFA) 108 (90 BASE) MCG/ACT inhaler Inhale 2 puffs into the lungs every 4 (four) hours as needed for wheezing or shortness of breath. 1 Inhaler 6  . Multiple Vitamin (MULTIVITAMIN) tablet Take 1 tablet by mouth daily.     No current facility-administered medications on file prior to visit.    BP 104/60 mmHg  Pulse 59  Temp(Src) 98 F (36.7 C) (Oral)  SpO2 98%  LMP 07/01/2015       Objective:   Physical Exam   General  Mental Status - Alert. General Appearance - Well groomed. Not in acute distress.  Skin Rashes- No Rashes.  HEENT Head- Normal. Ear Auditory Canal - Left- moderate swollen and faint tragal tendernessRight - Normal.Tympanic Membrane- Left- moderate bright red. Right- Normal. Eye Sclera/Conjunctiva- Left- Normal. Right- Normal. Nose & Sinuses Nasal Mucosa- Left-   Not Boggy or  Congested. Right-  Not Boggy or  Congested.Bilateral maxillary and frontal sinus pressure. Mouth & Throat Lips: Upper Lip- Normal: no dryness, cracking, pallor, cyanosis, or vesicular eruption. Lower Lip-Normal: no dryness, cracking, pallor, cyanosis or vesicular eruption. Buccal Mucosa- Bilateral- No Aphthous ulcers. Oropharynx- No Discharge or Erythema. Tonsils: Characteristics- Bilateral- No Erythema or Congestion. Size/Enlargement- Bilateral- No enlargement. Discharge- bilateral-None.  Neck Neck- Supple. No Masses.   Chest and Lung Exam Auscultation: Breath Sounds:-Clear even and unlabored.  Cardiovascular Auscultation:Rythm- Regular,  rate and rhythm. Murmurs & Other Heart Sounds:Ausculatation of the heart reveal- No Murmurs.  Lymphatic Head & Neck General Head & Neck Lymphatics: Bilateral: Description- only left side mild enlarged and faint tender submandibular node.      Assessment & Plan:  You do appear by exam to have both otitis externa and otitis media. Will rx cortisporin otic drops and augmentin oral antibiotic.   Symptoms should gradually  better. We can check ear in 10-14 days or as needed.  With history of recurrent infections we will watch closely. If infection does not  clear could refer to ENT.

## 2015-07-13 NOTE — Patient Instructions (Addendum)
You do appear by exam to have both otitis externa and otitis media. Will rx cortisporin otic drops and augmentin oral antibiotic.   Symptoms should gradually better. We can check ear in 10-14 days or as needed.  With history of recurrent infections we will watch closely. If infection  does not clear could refer to ENT.  Advised use probiotic while on antibiotic.  If you get yeast infection while on augmentin. Then rx of diflucan made available.

## 2015-08-05 DIAGNOSIS — Z01419 Encounter for gynecological examination (general) (routine) without abnormal findings: Secondary | ICD-10-CM | POA: Diagnosis not present

## 2015-08-05 DIAGNOSIS — Z6821 Body mass index (BMI) 21.0-21.9, adult: Secondary | ICD-10-CM | POA: Diagnosis not present

## 2015-08-19 DIAGNOSIS — B373 Candidiasis of vulva and vagina: Secondary | ICD-10-CM | POA: Diagnosis not present

## 2015-08-19 DIAGNOSIS — Z13220 Encounter for screening for lipoid disorders: Secondary | ICD-10-CM | POA: Diagnosis not present

## 2015-08-19 DIAGNOSIS — Z1321 Encounter for screening for nutritional disorder: Secondary | ICD-10-CM | POA: Diagnosis not present

## 2015-08-19 DIAGNOSIS — Z113 Encounter for screening for infections with a predominantly sexual mode of transmission: Secondary | ICD-10-CM | POA: Diagnosis not present

## 2015-08-19 DIAGNOSIS — Z1329 Encounter for screening for other suspected endocrine disorder: Secondary | ICD-10-CM | POA: Diagnosis not present

## 2015-11-14 DIAGNOSIS — N930 Postcoital and contact bleeding: Secondary | ICD-10-CM | POA: Diagnosis not present

## 2015-11-14 DIAGNOSIS — Z1321 Encounter for screening for nutritional disorder: Secondary | ICD-10-CM | POA: Diagnosis not present

## 2015-11-14 DIAGNOSIS — Z131 Encounter for screening for diabetes mellitus: Secondary | ICD-10-CM | POA: Diagnosis not present

## 2015-11-27 DIAGNOSIS — N201 Calculus of ureter: Secondary | ICD-10-CM | POA: Diagnosis not present

## 2015-11-27 DIAGNOSIS — N23 Unspecified renal colic: Secondary | ICD-10-CM | POA: Diagnosis not present

## 2015-11-28 DIAGNOSIS — N201 Calculus of ureter: Secondary | ICD-10-CM | POA: Diagnosis not present

## 2015-11-28 DIAGNOSIS — N23 Unspecified renal colic: Secondary | ICD-10-CM | POA: Diagnosis not present

## 2016-01-24 ENCOUNTER — Ambulatory Visit: Payer: 59 | Admitting: Physician Assistant

## 2016-01-25 ENCOUNTER — Encounter: Payer: Self-pay | Admitting: Physician Assistant

## 2016-01-25 ENCOUNTER — Ambulatory Visit (INDEPENDENT_AMBULATORY_CARE_PROVIDER_SITE_OTHER): Payer: 59 | Admitting: Physician Assistant

## 2016-01-25 VITALS — BP 100/57 | HR 58 | Temp 98.1°F | Resp 16 | Ht 67.0 in | Wt 125.2 lb

## 2016-01-25 DIAGNOSIS — R0982 Postnasal drip: Secondary | ICD-10-CM

## 2016-01-25 DIAGNOSIS — R61 Generalized hyperhidrosis: Secondary | ICD-10-CM | POA: Diagnosis not present

## 2016-01-25 LAB — CBC WITH DIFFERENTIAL/PLATELET
BASOS PCT: 0.8 % (ref 0.0–3.0)
Basophils Absolute: 0 10*3/uL (ref 0.0–0.1)
Eosinophils Absolute: 0.1 10*3/uL (ref 0.0–0.7)
Eosinophils Relative: 1.9 % (ref 0.0–5.0)
HEMATOCRIT: 43.3 % (ref 36.0–46.0)
HEMOGLOBIN: 14.5 g/dL (ref 12.0–15.0)
LYMPHS PCT: 34.7 % (ref 12.0–46.0)
Lymphs Abs: 2 10*3/uL (ref 0.7–4.0)
MCHC: 33.4 g/dL (ref 30.0–36.0)
MCV: 91.6 fl (ref 78.0–100.0)
MONO ABS: 0.6 10*3/uL (ref 0.1–1.0)
Monocytes Relative: 11 % (ref 3.0–12.0)
Neutro Abs: 3 10*3/uL (ref 1.4–7.7)
Neutrophils Relative %: 51.6 % (ref 43.0–77.0)
Platelets: 328 10*3/uL (ref 150.0–400.0)
RBC: 4.73 Mil/uL (ref 3.87–5.11)
RDW: 13.4 % (ref 11.5–15.5)
WBC: 5.8 10*3/uL (ref 4.0–10.5)

## 2016-01-25 LAB — TSH: TSH: 3.83 u[IU]/mL (ref 0.35–4.50)

## 2016-01-25 LAB — T4, FREE: Free T4: 0.69 ng/dL (ref 0.60–1.60)

## 2016-01-25 MED ORDER — FLUTICASONE PROPIONATE 50 MCG/ACT NA SUSP: 2.0000 | Freq: Every day | NASAL | 6 refills | Status: DC

## 2016-01-25 MED ORDER — BENZONATATE 100 MG PO CAPS: 100.0000 mg | ORAL_CAPSULE | Freq: Three times a day (TID) | ORAL | 0 refills | Status: DC | PRN

## 2016-01-25 NOTE — Patient Instructions (Signed)
Please go to the lab for blood work. I will call with your results. We are looking further into these night sweats.  Please start a daily Claritin. Use the Flonase daily as directed. Tessalon for cough. Stay hydrated. Place a humidifier in the bedroom.  Symptoms should continue to resolve. If not, let me know.

## 2016-01-25 NOTE — Progress Notes (Signed)
Patient presents to clinic today c/o 2 weeks of dry, non-productive cough that was initially accompanied by sore throat, chills and fatigue that have now resolved. Does note mild residual PND. Denies chest pain or SOB. Denies fever. Endorses friend who had similar symptoms at the same time.   Patient also endorses intermittent night sweats x 4 years. Endorses occurring a couple times per week to as infrequent as a few times a month. Endorses bed clothing drenched in sweat. Endorses is happening no matter where she sleeps (apartment versus at parents versus at a friend's house). Endorses turning thermostat down to a cold level with continued symptoms. Denies fever, chills or malaise. Denies weight loss. Endorses good appetite. Denies history of TB or exposure to TB. [place] in the past few years. Denies concern for HIV. Denies history or abnormal thyroid function.  Past Medical History:  Diagnosis Date  . Asthma    exercise induced  . UTI (lower urinary tract infection)     Current Outpatient Prescriptions on File Prior to Visit  Medication Sig Dispense Refill  . Adapalene-Benzoyl Peroxide 0.1-2.5 % gel Apply 1 drop topically at bedtime. 45 g 2  . albuterol (PROAIR HFA) 108 (90 BASE) MCG/ACT inhaler Inhale 2 puffs into the lungs every 4 (four) hours as needed for wheezing or shortness of breath. 1 Inhaler 6  . Multiple Vitamin (MULTIVITAMIN) tablet Take 1 tablet by mouth daily.     No current facility-administered medications on file prior to visit.     No Known Allergies  Family History  Problem Relation Age of Onset  . Hypertension Mother   . Hyperlipidemia Mother   . Glaucoma Father   . Aneurysm Maternal Grandmother     Social History   Social History  . Marital status: Single    Spouse name: N/A  . Number of children: N/A  . Years of education: N/A   Social History Main Topics  . Smoking status: Never Smoker  . Smokeless tobacco: None  . Alcohol use Yes   . Drug use: No  . Sexual activity: Yes   Other Topics Concern  . None   Social History Narrative  . None   Review of Systems - See HPI.  All other ROS are negative.  BP (!) 100/57 (BP Location: Left Arm, Patient Position: Sitting, Cuff Size: Normal)   Pulse (!) 58   Temp 98.1 F (36.7 C) (Oral)   Resp 16   Ht 5\' 7"  (1.702 m)   Wt 125 lb 4 oz (56.8 kg)   LMP 01/15/2016   SpO2 99%   BMI 19.62 kg/m   Physical Exam  Constitutional: She is oriented to person, place, and time and well-developed, well-nourished, and in no distress.  HENT:  Head: Normocephalic and atraumatic.  Right Ear: Tympanic membrane normal.  Left Ear: Tympanic membrane normal.  Nose: No mucosal edema or rhinorrhea. Right sinus exhibits no maxillary sinus tenderness and no frontal sinus tenderness. Left sinus exhibits no maxillary sinus tenderness and no frontal sinus tenderness.  Mouth/Throat: Uvula is midline, oropharynx is clear and moist and mucous membranes are normal.  Clear PND noted in posterior oropharynx.  Eyes: Conjunctivae are normal.  Neck: Neck supple.  Cardiovascular: Normal rate, regular rhythm, normal heart sounds and intact distal pulses.   Pulmonary/Chest: Effort normal and breath sounds normal. No respiratory distress. She has no wheezes. She has no rales. She exhibits no tenderness.  Lymphadenopathy:    She has no cervical adenopathy.  Neurological: She is alert and oriented to person, place, and time.  Skin: Skin is warm and dry. No rash noted.  Psychiatric: Affect normal.  Vitals reviewed.  Assessment/Plan: 1. Post-nasal drip PND and post-viral cough syndrome suspected. Will begin Flonase. Tessalon for cough. Hydration and supportive measures reviewed. Humidifier in bedroom. FU if not resolving.  - fluticasone (FLONASE) 50 MCG/ACT nasal spray; Place 2 sprays into both nostrils daily.  Dispense: 16 g; Refill: 6 - benzonatate (TESSALON) 100 MG capsule; Take 1 capsule (100 mg total)  by mouth 3 (three) times daily as needed.  Dispense: 30 capsule; Refill: 0  2. Night sweats Unclear etiology. Exam unremarkable. No weight loss or fevers. Will start with lab panel. Will proceed from there -- may have to consider CT. - CBC w/Diff - TSH - T4, free - HIV antibody - Quantiferon tb gold assay   Piedad ClimesMartin, Sephiroth Mcluckie Cody, PA-C

## 2016-01-26 LAB — HIV ANTIBODY (ROUTINE TESTING W REFLEX): HIV 1&2 Ab, 4th Generation: NONREACTIVE

## 2016-01-27 LAB — QUANTIFERON TB GOLD ASSAY (BLOOD)
INTERFERON GAMMA RELEASE ASSAY: NEGATIVE
Mitogen-Nil: 5.83 IU/mL
QUANTIFERON TB AG MINUS NIL: 0.01 [IU]/mL
Quantiferon Nil Value: 0.03 IU/mL

## 2016-01-30 DIAGNOSIS — R61 Generalized hyperhidrosis: Secondary | ICD-10-CM | POA: Insufficient documentation

## 2016-06-07 DIAGNOSIS — N760 Acute vaginitis: Secondary | ICD-10-CM | POA: Diagnosis not present

## 2016-06-07 MED FILL — FLUCONAZOLE 150 MG TAB: 150 | 6 days supply | Qty: 3 | Fill #0

## 2016-06-08 DIAGNOSIS — H52223 Regular astigmatism, bilateral: Secondary | ICD-10-CM | POA: Diagnosis not present

## 2016-06-08 DIAGNOSIS — H5213 Myopia, bilateral: Secondary | ICD-10-CM | POA: Diagnosis not present

## 2016-09-10 ENCOUNTER — Ambulatory Visit (INDEPENDENT_AMBULATORY_CARE_PROVIDER_SITE_OTHER): Payer: 59 | Admitting: Medical

## 2016-09-10 VITALS — BP 111/63 | HR 73 | Temp 98.4°F | Resp 16 | Ht 67.0 in | Wt 131.2 lb

## 2016-09-10 DIAGNOSIS — J40 Bronchitis, not specified as acute or chronic: Secondary | ICD-10-CM | POA: Diagnosis not present

## 2016-09-10 DIAGNOSIS — J301 Allergic rhinitis due to pollen: Secondary | ICD-10-CM

## 2016-09-10 DIAGNOSIS — R059 Cough, unspecified: Secondary | ICD-10-CM

## 2016-09-10 DIAGNOSIS — R05 Cough: Secondary | ICD-10-CM

## 2016-09-10 MED ORDER — FLUTICASONE PROPIONATE 50 MCG/ACT NA SUSP: 2.0000 | Freq: Every day | NASAL | 0 refills | Status: DC

## 2016-09-10 MED ORDER — HYDROCODONE-HOMATROPINE 5-1.5 MG/5ML PO SYRP: 5.0000 mL | ORAL_SOLUTION | Freq: Three times a day (TID) | ORAL | 0 refills | Status: DC | PRN

## 2016-09-10 MED ORDER — ALBUTEROL SULFATE HFA 108 (90 BASE) MCG/ACT IN AERS: 2.0000 | INHALATION_SPRAY | Freq: Four times a day (QID) | RESPIRATORY_TRACT | 0 refills | Status: DC | PRN

## 2016-09-10 MED ORDER — AZITHROMYCIN 250 MG PO TABS: ORAL_TABLET | ORAL | 0 refills | Status: DC

## 2016-09-10 NOTE — Patient Instructions (Addendum)
For nasal congestion and allergies rx flonase.(possible onset of illness allergy related vs infectious in light of your job)  For cough hycodan.  For bronchitis and possible sinus infection rx azithromycin.  For any wheezing if occurs rx albuterol inhaler.  Follow up 7 days or as needed

## 2016-09-10 NOTE — Progress Notes (Signed)
   Subjective:    Patient ID: Stacy Saunders, female    DOB: 08-22-1985, 31 y.o.   MRN: [id number]  HPI  Pt sick since last Wednesday.  Started nasal congestion and st. St improved but nasal congestion persists. Pt has been sneezing with runny nose. No sinus pressure. Pt had productive cough and some mild chest congestion.  Pt felt feverish last night with chills.  LMP- August 12, 2016.  Pt has exercised induced asthma.  Review of Systems  Constitutional: Negative for chills, fatigue and fever.  HENT: Positive for congestion, postnasal drip and sneezing. Negative for sinus pain and sinus pressure.   Respiratory: Positive for cough. Negative for chest tightness and wheezing.   Cardiovascular: Negative for chest pain and palpitations.  Gastrointestinal: Negative for abdominal pain and blood in stool.  Genitourinary: Negative for dysuria.  Skin: Negative for rash.  Neurological: Negative for headaches.  Hematological: Negative for adenopathy. Does not bruise/bleed easily.       Objective:   Physical Exam  General  Mental Status - Alert. General Appearance - Well groomed. Not in acute distress.  Skin Rashes- No Rashes.  HEENT Head- Normal. Ear Auditory Canal - Left- Normal. Right - Normal.Tympanic Membrane- Left- Normal. Right- Normal. Eye Sclera/Conjunctiva- Left- Normal. Right- Normal. Nose & Sinuses Nasal Mucosa- Left-  Boggy and Congested. Right-  Boggy and  Congested.Bilateral no  maxillary and no  frontal sinus pressure. Mouth & Throat Lips: Upper Lip- Normal: no dryness, cracking, pallor, cyanosis, or vesicular eruption. Lower Lip-Normal: no dryness, cracking, pallor, cyanosis or vesicular eruption. Buccal Mucosa- Bilateral- No Aphthous ulcers. Oropharynx- No Discharge or Erythema. +pnd. Tonsils: Characteristics- Bilateral- No Erythema or Congestion. Size/Enlargement- Bilateral- No enlargement. Discharge- bilateral-None.  Neck Neck- Supple. No Masses.   Chest  and Lung Exam Auscultation: Breath Sounds:-Clear even and unlabored.  Cardiovascular Auscultation:Rythm- Regular, rate and rhythm. Murmurs & Other Heart Sounds:Ausculatation of the heart reveal- No Murmurs.  Lymphatic Head & Neck General Head & Neck Lymphatics: Bilateral: Description- No Localized lymphadenopathy.       Assessment & Plan:  For nasal congestion and allergies rx flonase.  For cough hycodan.  For bronchitis and possible sinus infection rx azithromycin.  For any wheezing if occurs rx albuterol inhaler.  Follow up 7 days or as needed  Paige Monarrez, Ramon Dredge, [employer]

## 2016-09-10 NOTE — Progress Notes (Signed)
Pre visit review using our clinic review tool, if applicable. No additional management support is needed unless otherwise documented below in the visit note. 

## 2016-09-13 DIAGNOSIS — S8001XA Contusion of right knee, initial encounter: Secondary | ICD-10-CM | POA: Diagnosis not present

## 2016-09-13 DIAGNOSIS — S8002XA Contusion of left knee, initial encounter: Secondary | ICD-10-CM | POA: Diagnosis not present

## 2016-09-13 DIAGNOSIS — S299XXA Unspecified injury of thorax, initial encounter: Secondary | ICD-10-CM | POA: Diagnosis present

## 2016-09-13 DIAGNOSIS — Y92410 Unspecified street and highway as the place of occurrence of the external cause: Secondary | ICD-10-CM | POA: Insufficient documentation

## 2016-09-13 DIAGNOSIS — Y999 Unspecified external cause status: Secondary | ICD-10-CM | POA: Diagnosis not present

## 2016-09-13 DIAGNOSIS — R0781 Pleurodynia: Secondary | ICD-10-CM | POA: Insufficient documentation

## 2016-09-13 DIAGNOSIS — J45909 Unspecified asthma, uncomplicated: Secondary | ICD-10-CM | POA: Insufficient documentation

## 2016-09-13 DIAGNOSIS — R0789 Other chest pain: Secondary | ICD-10-CM | POA: Diagnosis not present

## 2016-09-13 DIAGNOSIS — Z79899 Other long term (current) drug therapy: Secondary | ICD-10-CM | POA: Diagnosis not present

## 2016-09-13 DIAGNOSIS — Y939 Activity, unspecified: Secondary | ICD-10-CM | POA: Diagnosis not present

## 2016-09-13 MED ORDER — IBUPROFEN 200 MG PO TABS
400.0000 mg | ORAL_TABLET | Freq: Once | ORAL | Status: AC | PRN
  Administered 2016-09-13: 400 mg via ORAL
  Filled 2016-09-13: qty 2
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
## 2016-09-14 ENCOUNTER — Emergency Department (HOSPITAL_COMMUNITY): Payer: 59

## 2016-09-14 ENCOUNTER — Emergency Department (HOSPITAL_COMMUNITY)
Admission: EM | Admit: 2016-09-14 | Discharge: 2016-09-14 | Disposition: A | Discharge status: Home / Self Care | Payer: 59 | Attending: Emergency Medicine | Admitting: Emergency Medicine

## 2016-09-14 DIAGNOSIS — S299XXA Unspecified injury of thorax, initial encounter: Secondary | ICD-10-CM | POA: Diagnosis not present

## 2016-09-14 DIAGNOSIS — R0781 Pleurodynia: Secondary | ICD-10-CM | POA: Diagnosis not present

## 2016-09-14 DIAGNOSIS — Z79899 Other long term (current) drug therapy: Secondary | ICD-10-CM | POA: Diagnosis not present

## 2016-09-14 DIAGNOSIS — J45909 Unspecified asthma, uncomplicated: Secondary | ICD-10-CM | POA: Diagnosis not present

## 2016-09-14 MED ORDER — NAPROXEN 500 MG PO TABS: 500.0000 mg | ORAL_TABLET | Freq: Two times a day (BID) | ORAL | 0 refills | Status: DC

## 2016-09-14 MED ORDER — METHOCARBAMOL 500 MG PO TABS: 500.0000 mg | ORAL_TABLET | Freq: Two times a day (BID) | ORAL | 0 refills | Status: DC
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
## 2016-09-14 NOTE — Discharge Instructions (Signed)
Take your medications as prescribed. I also recommend applying ice and/or heat to affected area for 15-20 minutes 3-4 times daily for additional pain relief. Refrain from doing any heavy lifting, squatting or repetitive movements that exacerbate your symptoms. Follow-up with your primary care provider in the next week if her symptoms have not improved.  Please return to the Emergency Department if symptoms worsen or new onset of fever, chest pain, difficulty breathing, abdominal pain, vomiting, numbness, weakness, neck/back pain.

## 2016-10-02 DIAGNOSIS — N762 Acute vulvitis: Secondary | ICD-10-CM | POA: Diagnosis not present

## 2016-10-02 DIAGNOSIS — Z113 Encounter for screening for infections with a predominantly sexual mode of transmission: Secondary | ICD-10-CM | POA: Diagnosis not present

## 2016-10-02 MED FILL — NYSTATIN 100,000 UNIT/GM CR: 100000 | 14 days supply | Qty: 30 | Fill #0

## 2016-10-02 MED FILL — TRIAMCINOLONE 0.1% CREAM: 0.1 | 14 days supply | Qty: 30 | Fill #0

## 2016-10-02 MED FILL — FLUCONAZOLE 150 MG TABLET: 150 | 6 days supply | Qty: 3 | Fill #0

## 2016-10-12 DIAGNOSIS — L219 Seborrheic dermatitis, unspecified: Secondary | ICD-10-CM | POA: Diagnosis not present

## 2016-10-12 DIAGNOSIS — L700 Acne vulgaris: Secondary | ICD-10-CM | POA: Diagnosis not present

## 2016-10-12 MED FILL — CLINDAMYCIN PHOSP 1% LOTION: 1 | 30 days supply | Qty: 60 | Fill #0

## 2016-10-12 MED FILL — KETOCONAZOLE 2% SHAMPOO: 2 | 30 days supply | Qty: 120 | Fill #0

## 2016-10-16 MED FILL — FLUOCINOLONE 0.01% SCALP OI: 0.01 | 30 days supply | Qty: 118 | Fill #0

## 2016-10-19 MED FILL — TRETINOIN 0.05% CREAM: 0.05 | 30 days supply | Qty: 45 | Fill #0

## 2016-11-19 DIAGNOSIS — Z6820 Body mass index (BMI) 20.0-20.9, adult: Secondary | ICD-10-CM | POA: Diagnosis not present

## 2016-11-19 DIAGNOSIS — Z01419 Encounter for gynecological examination (general) (routine) without abnormal findings: Secondary | ICD-10-CM | POA: Diagnosis not present

## 2016-12-28 ENCOUNTER — Ambulatory Visit (HOSPITAL_BASED_OUTPATIENT_CLINIC_OR_DEPARTMENT_OTHER)
Admission: RE | Admit: 2016-12-28 | Discharge: 2016-12-28 | Disposition: A | Discharge status: Home / Self Care | Payer: 59 | Source: Ambulatory Visit | Attending: Medical | Admitting: Medical

## 2016-12-28 ENCOUNTER — Ambulatory Visit (INDEPENDENT_AMBULATORY_CARE_PROVIDER_SITE_OTHER): Payer: 59 | Admitting: Medical

## 2016-12-28 ENCOUNTER — Encounter: Payer: Self-pay | Admitting: Medical

## 2016-12-28 VITALS — BP 98/54 | HR 54 | Temp 97.7°F | Resp 18 | Wt 130.6 lb

## 2016-12-28 DIAGNOSIS — R221 Localized swelling, mass and lump, neck: Secondary | ICD-10-CM | POA: Diagnosis not present

## 2016-12-28 DIAGNOSIS — R591 Generalized enlarged lymph nodes: Secondary | ICD-10-CM

## 2016-12-28 DIAGNOSIS — E010 Iodine-deficiency related diffuse (endemic) goiter: Secondary | ICD-10-CM

## 2016-12-28 LAB — CBC WITH DIFFERENTIAL/PLATELET
BASOS ABS: 0 10*3/uL (ref 0.0–0.1)
Basophils Relative: 0.7 % (ref 0.0–3.0)
EOS ABS: 0.1 10*3/uL (ref 0.0–0.7)
Eosinophils Relative: 1.2 % (ref 0.0–5.0)
HEMATOCRIT: 44 % (ref 36.0–46.0)
Hemoglobin: 14.3 g/dL (ref 12.0–15.0)
LYMPHS PCT: 37.5 % (ref 12.0–46.0)
Lymphs Abs: 1.6 10*3/uL (ref 0.7–4.0)
MCHC: 32.5 g/dL (ref 30.0–36.0)
MCV: 94.9 fl (ref 78.0–100.0)
Monocytes Absolute: 0.4 10*3/uL (ref 0.1–1.0)
Monocytes Relative: 10 % (ref 3.0–12.0)
NEUTROS PCT: 50.6 % (ref 43.0–77.0)
Neutro Abs: 2.2 10*3/uL (ref 1.4–7.7)
Platelets: 268 10*3/uL (ref 150.0–400.0)
RBC: 4.64 Mil/uL (ref 3.87–5.11)
RDW: 13 % (ref 11.5–15.5)
WBC: 4.4 10*3/uL (ref 4.0–10.5)

## 2016-12-28 LAB — T4, FREE: Free T4: 0.95 ng/dL (ref 0.60–1.60)

## 2016-12-28 LAB — TSH: TSH: 3.57 u[IU]/mL (ref 0.35–4.50)

## 2016-12-28 MED ORDER — CEPHALEXIN 500 MG PO CAPS: 500.0000 mg | ORAL_CAPSULE | Freq: Two times a day (BID) | ORAL | 0 refills | Status: DC

## 2016-12-28 MED FILL — CEPHALEXIN 500 MG CAPSULE: 500 | 10 days supply | Qty: 20 | Fill #0

## 2016-12-28 NOTE — Progress Notes (Signed)
Subjective:    Patient ID: Stacy Saunders, female    DOB: 1985-10-21, 31 y.o.   MRN: [id number]  HPI  Pt has small lump lateral side of her neck. Noticed on Monday. Pain is minimal at rest. If presses on area hurts a little worse. No fever, no chills or sweats. No insect bites. Pt has not taken anything for pain.  Pt reports no history of hypo or hyper thyroid.  LMP- 3 weeks ago approx. Not late.  Pt transferring to me from [facility] PA-C. But I have seen her before.  Review of Systems  Constitutional: Negative for chills, diaphoresis, fatigue and fever.  Respiratory: Negative for cough, chest tightness, shortness of breath and wheezing.   Cardiovascular: Negative for chest pain and palpitations.  Gastrointestinal: Negative for abdominal pain, blood in stool and vomiting.  Endocrine: Negative for polydipsia, polyphagia and polyuria.  Musculoskeletal: Positive for neck pain. Negative for arthralgias, gait problem, joint swelling, myalgias and neck stiffness.       See hpi.   Skin: Negative for rash.  Neurological: Negative for dizziness, weakness, numbness and headaches.  Hematological: Positive for adenopathy. Does not bruise/bleed easily.       Vs neck mass/lump.  Psychiatric/Behavioral: Negative for behavioral problems and decreased concentration.    Past Medical History:  Diagnosis Date  . Asthma    exercise induced  . UTI (lower urinary tract infection)      Social History   Social History  . Marital status: Single    Spouse name: N/A  . Number of children: N/A  . Years of education: N/A   Occupational History  . Not on file.   Social History Main Topics  . Smoking status: Never Smoker  . Smokeless tobacco: Never Used  . Alcohol use Yes  . Drug use: No  . Sexual activity: Yes   Other Topics Concern  . Not on file   Social History Narrative  . No narrative on file    No past surgical history on file.  Family History  Problem Relation Age of  Onset  . Hypertension Mother   . Hyperlipidemia Mother   . Glaucoma Father   . Aneurysm Maternal Grandmother     No Known Allergies  No current outpatient prescriptions on file prior to visit.   No current facility-administered medications on file prior to visit.     BP (!) 98/54 (BP Location: Left Arm, Patient Position: Sitting, Cuff Size: Normal)   Pulse (!) 54   Temp 97.7 F (36.5 C) (Oral)   Resp 18   Wt 130 lb 9.6 oz (59.2 kg)   SpO2 98%   BMI 20.45 kg/m       Objective:   Physical Exam  General  Mental Status - Alert. General Appearance - Well groomed. Not in acute distress.  Skin Rashes- No Rashes.  HEENT Head- Normal. Ear Auditory Canal - Left- Normal. Right - Normal.Tympanic Membrane- Left- Normal. Right- Normal. Eye Sclera/Conjunctiva- Left- Normal. Right- Normal. Nose & Sinuses Nasal Mucosa- Left- not   Boggy and Congested. Right- not   Boggy and  Congested.Bilateral no maxillary and no  frontal sinus pressure. Mouth & Throat Lips: Upper Lip- Normal: no dryness, cracking, pallor, cyanosis, or vesicular eruption. Lower Lip-Normal: no dryness, cracking, pallor, cyanosis or vesicular eruption. Buccal Mucosa- Bilateral- No Aphthous ulcers. Oropharynx- No Discharge or Erythema. Tonsils: Characteristics- Bilateral- No Erythema or Congestion. Size/Enlargement- Bilateral- No enlargement. Discharge- bilateral-None.  Neck Neck- Supple. No Masses. Left side  of neck. 1.5- 2.0 cm lump/mass. Mild tender. No redness, no fluctuance. Mild thyromegally. Rt side maybe larger than left.  Chest and Lung Exam Auscultation: Breath Sounds:-Clear even and unlabored.  Cardiovascular Auscultation:Rythm- Regular, rate and rhythm. Murmurs & Other Heart Sounds:Ausculatation of the heart reveal- No Murmurs.  Lymphatic Head & Neck General Head & Neck Lymphatics: Bilateral: Description- No Localized lymphadenopathy. But left sie neck lump may be node?       Assessment &  Plan:  For your neck lump region pain can use ibuprofen for pain. Will make keflex available to use if needed. But will advise whether to start after studies are back.  Will get tsh, t4 and cbc today. US neck at 12:30.  Follow up 7-10 days or as needed

## 2016-12-28 NOTE — Patient Instructions (Signed)
For your neck lump region pain can use ibuprofen for pain. Will make keflex available to use if needed. But will advise whether to start after studies are back.  Will get tsh, t4 and cbc today. US neck at 12:30.  Follow up 7-10 days or as needed

## 2016-12-29 ENCOUNTER — Telehealth: Payer: Self-pay | Admitting: Medical

## 2016-12-29 DIAGNOSIS — R221 Localized swelling, mass and lump, neck: Secondary | ICD-10-CM

## 2016-12-29 NOTE — Telephone Encounter (Signed)
Ct neck placed.

## 2016-12-31 ENCOUNTER — Telehealth: Payer: Self-pay | Admitting: Medical

## 2016-12-31 NOTE — Telephone Encounter (Signed)
Pt calling to go over lab results.

## 2017-01-02 ENCOUNTER — Ambulatory Visit (HOSPITAL_BASED_OUTPATIENT_CLINIC_OR_DEPARTMENT_OTHER)
Admission: RE | Admit: 2017-01-02 | Discharge: 2017-01-02 | Disposition: A | Discharge status: Home / Self Care | Payer: 59 | Source: Ambulatory Visit | Attending: Medical | Admitting: Medical

## 2017-01-02 ENCOUNTER — Encounter (HOSPITAL_BASED_OUTPATIENT_CLINIC_OR_DEPARTMENT_OTHER): Payer: Self-pay

## 2017-01-02 DIAGNOSIS — R221 Localized swelling, mass and lump, neck: Secondary | ICD-10-CM | POA: Diagnosis not present

## 2017-01-02 MED ORDER — IOPAMIDOL (ISOVUE-300) INJECTION 61%
100.0000 mL | Freq: Once | INTRAVENOUS | Status: AC | PRN
  Administered 2017-01-02: 75 mL via INTRAVENOUS

## 2017-01-02 MED FILL — FLUCONAZOLE 150 MG TABLET: 150 | 6 days supply | Qty: 3 | Fill #1

## 2017-01-02 NOTE — Telephone Encounter (Signed)
Pt.notified

## 2017-02-15 MED FILL — KETOCONAZOLE 2% SHAMPOO: 2 | 30 days supply | Qty: 120 | Fill #1

## 2017-04-12 MED FILL — FLUCONAZOLE 150 MG TABLET: 150 | 6 days supply | Qty: 3 | Fill #2

## 2017-04-15 DIAGNOSIS — Z113 Encounter for screening for infections with a predominantly sexual mode of transmission: Secondary | ICD-10-CM | POA: Diagnosis not present

## 2017-04-15 DIAGNOSIS — N760 Acute vaginitis: Secondary | ICD-10-CM | POA: Diagnosis not present

## 2017-06-07 MED FILL — CLINDAMYCIN PH 1% SOLUTION: 1 | 30 days supply | Qty: 60 | Fill #0

## 2017-06-07 MED FILL — TRETINOIN 0.05% CREAM: 0.05 | 30 days supply | Qty: 45 | Fill #1

## 2017-06-19 ENCOUNTER — Encounter: Payer: 59 | Admitting: Medical

## 2017-07-01 ENCOUNTER — Encounter: Payer: Self-pay | Admitting: Medical

## 2017-07-05 ENCOUNTER — Ambulatory Visit (INDEPENDENT_AMBULATORY_CARE_PROVIDER_SITE_OTHER): Payer: No Typology Code available for payment source | Admitting: Medical

## 2017-07-05 ENCOUNTER — Encounter: Payer: Self-pay | Admitting: Medical

## 2017-07-05 VITALS — BP 117/60 | HR 65 | Temp 98.4°F | Resp 16 | Ht 67.0 in | Wt 140.6 lb

## 2017-07-05 DIAGNOSIS — R05 Cough: Secondary | ICD-10-CM | POA: Diagnosis not present

## 2017-07-05 DIAGNOSIS — B349 Viral infection, unspecified: Secondary | ICD-10-CM

## 2017-07-05 DIAGNOSIS — R059 Cough, unspecified: Secondary | ICD-10-CM

## 2017-07-05 DIAGNOSIS — M791 Myalgia, unspecified site: Secondary | ICD-10-CM | POA: Diagnosis not present

## 2017-07-05 MED ORDER — BENZONATATE 100 MG PO CAPS: 100.0000 mg | ORAL_CAPSULE | Freq: Three times a day (TID) | ORAL | 0 refills | Status: DC | PRN

## 2017-07-05 MED ORDER — OSELTAMIVIR PHOSPHATE 75 MG PO CAPS: 75.0000 mg | ORAL_CAPSULE | Freq: Two times a day (BID) | ORAL | 0 refills | Status: DC

## 2017-07-05 MED ORDER — FLUTICASONE PROPIONATE 50 MCG/ACT NA SUSP: 2.0000 | Freq: Every day | NASAL | 1 refills | Status: DC

## 2017-07-05 MED FILL — FLUTICASONE PROP 50 MCG SPR: 50 | 30 days supply | Qty: 16 | Fill #0

## 2017-07-05 NOTE — Progress Notes (Addendum)
Subjective:    Patient ID: Stacy Saunders, female    DOB: 1985/11/20, 32 y.o.   MRN: [id number]  HPI   Pt in with body aches, chills, ha and fatigue.(started early this morning) Pt states aches more in back, and neck. Achiness is level 4/10. Pt has gotten flu vaccine. Pt not sure of any fever. No productive cough. Some nasal congestion.  Pt niece has been sick.(She had runny nose and cough)  Pt is scheduled to work this weekend.   Review of Systems  Constitutional: Negative for chills, fatigue and fever.  HENT: Positive for congestion. Negative for ear pain, hearing loss, mouth sores and nosebleeds.   Respiratory: Positive for cough. Negative for chest tightness, shortness of breath and wheezing.   Cardiovascular: Negative for chest pain and palpitations.  Gastrointestinal: Negative for abdominal distention, anal bleeding, constipation and vomiting.  Musculoskeletal: Positive for myalgias. Negative for back pain, neck pain and neck stiffness.  Skin: Negative for color change and rash.  Neurological: Negative for dizziness and headaches.  Hematological: Negative for adenopathy. Does not bruise/bleed easily.  Psychiatric/Behavioral: Negative for behavioral problems, confusion, self-injury and suicidal ideas. The patient is not nervous/anxious.     Past Medical History:  Diagnosis Date  . Asthma    exercise induced  . UTI (lower urinary tract infection)      Social History   Socioeconomic History  . Marital status: Single    Spouse name: Not on file  . Number of children: Not on file  . Years of education: Not on file  . Highest education level: Not on file  Social Needs  . Financial resource strain: Not on file  . Food insecurity - worry: Not on file  . Food insecurity - inability: Not on file  . Transportation needs - medical: Not on file  . Transportation needs - non-medical: Not on file  Occupational History  . Not on file  Tobacco Use  . Smoking status: Never  Smoker  . Smokeless tobacco: Never Used  Substance and Sexual Activity  . Alcohol use: Yes  . Drug use: No  . Sexual activity: Yes  Other Topics Concern  . Not on file  Social History Narrative  . Not on file    No past surgical history on file.  Family History  Problem Relation Age of Onset  . Hypertension Mother   . Hyperlipidemia Mother   . Glaucoma Father   . Aneurysm Maternal Grandmother     No Known Allergies  No current outpatient medications on file prior to visit.   No current facility-administered medications on file prior to visit.     BP 117/60   Pulse 65   Temp 98.4 F (36.9 C) (Oral)   Resp 16   Ht 5\' 7"  (1.702 m)   Wt 140 lb 9.6 oz (63.8 kg)   SpO2 100%   BMI 22.02 kg/m       Objective:   Physical Exam  General  Mental Status - Alert. General Appearance - Well groomed. Not in acute distress.  Skin Rashes- No Rashes.  HEENT Head- Normal. Ear Auditory Canal - Left- Normal. Right - Normal.Tympanic Membrane- Left- Normal. Right- Normal. Eye Sclera/Conjunctiva- Left- Normal. Right- Normal. Nose & Sinuses Nasal Mucosa- Left-  Boggy and Congested. Right-  Boggy and  Congested.Bilateral no maxillary and no frontal sinus pressure. Mouth & Throat Lips: Upper Lip- Normal: no dryness, cracking, pallor, cyanosis, or vesicular eruption. Lower Lip-Normal: no dryness, cracking, pallor, cyanosis or  vesicular eruption. Buccal Mucosa- Bilateral- No Aphthous ulcers. Oropharynx- No Discharge or Erythema. Tonsils: Characteristics- Bilateral- No Erythema or Congestion. Size/Enlargement- Bilateral- No enlargement. Discharge- bilateral-None.  Neck Neck- Supple. No Masses.   Chest and Lung Exam Auscultation: Breath Sounds:-Clear even and unlabored.  Cardiovascular Auscultation:Rythm- Regular, rate and rhythm. Murmurs & Other Heart Sounds:Ausculatation of the heart reveal- No Murmurs.  Lymphatic Head & Neck General Head & Neck Lymphatics: Bilateral:  Description- No Localized lymphadenopathy.       Assessment & Plan:  You appear to have uri/viral syndrome but not flu presently. Test was negative. But if you have severe worse symptoms over next 24 hours as explained then can start tamiflu as you would still be in treatment time frame.  For cough rx benzonatate. For nasal congestion rx flonase  If ny bacterial infection type symptoms develop such as bronchitis or sinusitis notify me and would rx antibiotic.  Follow up in 7 days or as needed  Patient initially was scheduled for physical but then came in for acute visit.  She also was not fasting.  Offered to do complete physical but she stated she would reschedule for next Friday.  Esperanza RichtersEdward Anne Sebring, PA-C

## 2017-07-05 NOTE — Patient Instructions (Addendum)
You appear to have uri/viral syndrome but not flu presently. Test was negative. But if you have severe worse symptoms over next 24 hours as explained then can start tamiflu as you would still be in treatment time frame.  For cough rx benzonatate. For nasal congestion rx use flonase  If any bacterial infection type symptoms develop such as bronchitis or sinusitis notify me and would rx antibiotic.  Follow up in 7 days or as needed

## 2017-07-12 ENCOUNTER — Encounter: Payer: Self-pay | Admitting: Medical

## 2017-07-12 ENCOUNTER — Ambulatory Visit (INDEPENDENT_AMBULATORY_CARE_PROVIDER_SITE_OTHER): Payer: No Typology Code available for payment source | Admitting: Medical

## 2017-07-12 VITALS — BP 99/58 | HR 53 | Temp 98.0°F | Resp 16 | Ht 67.0 in | Wt 135.6 lb

## 2017-07-12 DIAGNOSIS — Z0000 Encounter for general adult medical examination without abnormal findings: Secondary | ICD-10-CM | POA: Diagnosis not present

## 2017-07-12 LAB — URINALYSIS, ROUTINE W REFLEX MICROSCOPIC
Bilirubin Urine: NEGATIVE
Ketones, ur: NEGATIVE
Leukocytes, UA: NEGATIVE
NITRITE: NEGATIVE
PH: 6 (ref 5.0–8.0)
Specific Gravity, Urine: >=1.03 — AB (ref 1.000–1.030)
TOTAL PROTEIN, URINE-UPE24: NEGATIVE
Urine Glucose: NEGATIVE
Urobilinogen, UA: 0.2 (ref 0.0–1.0)

## 2017-07-12 LAB — COMPREHENSIVE METABOLIC PANEL
ALBUMIN: 3.8 g/dL (ref 3.5–5.2)
ALK PHOS: 46 U/L (ref 39–117)
ALT: 9 U/L (ref 0–35)
AST: 14 U/L (ref 0–37)
BILIRUBIN TOTAL: 0.3 mg/dL (ref 0.2–1.2)
BUN: 14 mg/dL (ref 6–23)
CO2: 28 mEq/L (ref 19–32)
Calcium: 9.5 mg/dL (ref 8.4–10.5)
Chloride: 107 mEq/L (ref 96–112)
Creatinine, Ser: 0.88 mg/dL (ref 0.40–1.20)
GFR: 97.36 mL/min (ref >60.00)
Glucose, Bld: 85 mg/dL (ref 70–99)
Potassium: 3.9 mEq/L (ref 3.5–5.1)
Sodium: 140 mEq/L (ref 135–145)
Total Protein: 7.1 g/dL (ref 6.0–8.3)

## 2017-07-12 LAB — CBC WITH DIFFERENTIAL/PLATELET
BASOS ABS: 0 10*3/uL (ref 0.0–0.1)
Basophils Relative: 1.1 % (ref 0.0–3.0)
EOS ABS: 0.1 10*3/uL (ref 0.0–0.7)
EOS PCT: 2.2 % (ref 0.0–5.0)
HCT: 41.9 % (ref 36.0–46.0)
HEMOGLOBIN: 13.9 g/dL (ref 12.0–15.0)
LYMPHS ABS: 1.8 10*3/uL (ref 0.7–4.0)
Lymphocytes Relative: 45.4 % (ref 12.0–46.0)
MCHC: 33.1 g/dL (ref 30.0–36.0)
MCV: 93.2 fl (ref 78.0–100.0)
MONO ABS: 0.4 10*3/uL (ref 0.1–1.0)
Monocytes Relative: 9.4 % (ref 3.0–12.0)
NEUTROS PCT: 41.9 % — AB (ref 43.0–77.0)
Neutro Abs: 1.7 10*3/uL (ref 1.4–7.7)
Platelets: 270 10*3/uL (ref 150.0–400.0)
RBC: 4.5 Mil/uL (ref 3.87–5.11)
RDW: 12.9 % (ref 11.5–15.5)
WBC: 4 10*3/uL (ref 4.0–10.5)

## 2017-07-12 NOTE — Patient Instructions (Addendum)
For you wellness exam today I have ordered cbc, cmp, lipid panel, and ua.  Vaccine not given today. Up to date.  Recommend exercise and healthy diet.  We will let you know lab results as they come in.  Follow up date appointment will be determined after lab review.    Preventive Care 18-39 Years, Female Preventive care refers to lifestyle choices and visits with your health care provider that can promote health and wellness. What does preventive care include?  A yearly physical exam. This is also called an annual well check.  Dental exams once or twice a year.  Routine eye exams. Ask your health care provider how often you should have your eyes checked.  Personal lifestyle choices, including: ? Daily care of your teeth and gums. ? Regular physical activity. ? Eating a healthy diet. ? Avoiding tobacco and drug use. ? Limiting alcohol use. ? Practicing safe sex. ? Taking vitamin and mineral supplements as recommended by your health care provider. What happens during an annual well check? The services and screenings done by your health care provider during your annual well check will depend on your age, overall health, lifestyle risk factors, and family history of disease. Counseling Your health care provider may ask you questions about your:  Alcohol use.  Tobacco use.  Drug use.  Emotional well-being.  Home and relationship well-being.  Sexual activity.  Eating habits.  Work and work Statistician.  Method of birth control.  Menstrual cycle.  Pregnancy history.  Screening You may have the following tests or measurements:  Height, weight, and BMI.  Diabetes screening. This is done by checking your blood sugar (glucose) after you have not eaten for a while (fasting).  Blood pressure.  Lipid and cholesterol levels. These may be checked every 5 years starting at age 32.  Skin check.  Hepatitis C blood test.  Hepatitis B blood test.  Sexually  transmitted disease (STD) testing.  BRCA-related cancer screening. This may be done if you have a family history of breast, ovarian, tubal, or peritoneal cancers.  Pelvic exam and Pap test. This may be done every 3 years starting at age 79. Starting at age 26, this may be done every 5 years if you have a Pap test in combination with an HPV test.  Discuss your test results, treatment options, and if necessary, the need for more tests with your health care provider. Vaccines Your health care provider may recommend certain vaccines, such as:  Influenza vaccine. This is recommended every year.  Tetanus, diphtheria, and acellular pertussis (Tdap, Td) vaccine. You may need a Td booster every 10 years.  Varicella vaccine. You may need this if you have not been vaccinated.  HPV vaccine. If you are 30 or younger, you may need three doses over 6 months.  Measles, mumps, and rubella (MMR) vaccine. You may need at least one dose of MMR. You may also need a second dose.  Pneumococcal 13-valent conjugate (PCV13) vaccine. You may need this if you have certain conditions and were not previously vaccinated.  Pneumococcal polysaccharide (PPSV23) vaccine. You may need one or two doses if you smoke cigarettes or if you have certain conditions.  Meningococcal vaccine. One dose is recommended if you are age 89-21 years and a first-year college student living in a residence hall, or if you have one of several medical conditions. You may also need additional booster doses.  Hepatitis A vaccine. You may need this if you have certain conditions or if you  travel or work in places where you may be exposed to hepatitis A.  Hepatitis B vaccine. You may need this if you have certain conditions or if you travel or work in places where you may be exposed to hepatitis B.  Haemophilus influenzae type b (Hib) vaccine. You may need this if you have certain risk factors.  Talk to your health care provider about which  screenings and vaccines you need and how often you need them. This information is not intended to replace advice given to you by your health care provider. Make sure you discuss any questions you have with your health care provider. Document Released: 06/26/2001 Document Revised: 01/18/2016 Document Reviewed: 03/01/2015 Elsevier Interactive Patient Education  2018 Elsevier Inc.  

## 2017-07-12 NOTE — Progress Notes (Signed)
Subjective:    Patient ID: Stacy Saunders, female    DOB: August 11, 1985, 32 y.o.   MRN: [id number]  HPI  Here for cpe. She is fasting.   Pt is up to date on flu vaccine.  He did get better from illness the other day.  Pt does not exercise regularly. No red meat or any pork. Non smoker. Couple of glasses of wine a week.    Review of Systems  Constitutional: Negative for chills and fatigue.  HENT: Negative for congestion, sinus pressure and sinus pain.   Respiratory: Negative for cough, chest tightness, shortness of breath and wheezing.   Cardiovascular: Negative for palpitations.  Gastrointestinal: Negative for abdominal pain.  Genitourinary: Negative for difficulty urinating, dysuria, genital sores, hematuria, urgency and vaginal discharge.  Musculoskeletal: Negative for myalgias.  Skin: Negative for rash.  Neurological: Negative for dizziness, facial asymmetry, weakness and headaches.  Hematological: Negative for adenopathy. Does not bruise/bleed easily.  Psychiatric/Behavioral: Negative for behavioral problems and confusion.    Past Medical History:  Diagnosis Date  . Asthma    exercise induced  . UTI (lower urinary tract infection)      Social History   Socioeconomic History  . Marital status: Single    Spouse name: Not on file  . Number of children: Not on file  . Years of education: Not on file  . Highest education level: Not on file  Social Needs  . Financial resource strain: Not on file  . Food insecurity - worry: Not on file  . Food insecurity - inability: Not on file  . Transportation needs - medical: Not on file  . Transportation needs - non-medical: Not on file  Occupational History  . Not on file  Tobacco Use  . Smoking status: Never Smoker  . Smokeless tobacco: Never Used  Substance and Sexual Activity  . Alcohol use: Yes  . Drug use: No  . Sexual activity: Yes  Other Topics Concern  . Not on file  Social History Narrative  . Not on file     No past surgical history on file.  Family History  Problem Relation Age of Onset  . Hypertension Mother   . Hyperlipidemia Mother   . Glaucoma Father   . Aneurysm Maternal Grandmother     No Known Allergies  Current Outpatient Medications on File Prior to Visit  Medication Sig Dispense Refill  . fluticasone (FLONASE) 50 MCG/ACT nasal spray Place 2 sprays into both nostrils daily. 16 g 1  . benzonatate (TESSALON) 100 MG capsule Take 1 capsule (100 mg total) by mouth 3 (three) times daily as needed for cough. (Patient not taking: Reported on 07/12/2017) 21 capsule 0   No current facility-administered medications on file prior to visit.     BP (!) 99/58   Pulse (!) 53   Temp 98 F (36.7 C) (Oral)   Resp 16   Ht 5\' 7"  (1.702 m)   Wt 135 lb 9.6 oz (61.5 kg)   SpO2 99%   BMI 21.24 kg/m       Objective:   Physical Exam  General Mental Status- Alert. General Appearance- Not in acute distress.   Skin General: Color- Normal Color. Moisture- Normal Moisture.  Neck Carotid Arteries- Normal color. Moisture- Normal Moisture. No carotid bruits. No JVD.  Chest and Lung Exam Auscultation: Breath Sounds:-Normal.  Cardiovascular Auscultation:Rythm- Regular. Murmurs & Other Heart Sounds:Auscultation of the heart reveals- No Murmurs.  Abdomen Inspection:-Inspeection Normal. Palpation/Percussion:Note:No mass. Palpation and Percussion of  the abdomen reveal- Non Tender, Non Distended + BS, no rebound or guarding.    Neurologic Cranial Nerve exam:- CN III-XII intact(No nystagmus), symmetric smile. Strength:- 5/5 equal and symmetric strength both upper and lower extremities.     Assessment & Plan:  For you wellness exam today I have ordered cbc, cmp, lipid panel, and ua.  Vaccine not given today. Up to date.  Recommend exercise and healthy diet.  We will let you know lab results as they come in.  Follow up date appointment will be determined after lab review.

## 2017-07-13 ENCOUNTER — Telehealth: Payer: Self-pay | Admitting: Medical

## 2017-07-13 NOTE — Telephone Encounter (Signed)
Cbc showed no anemia and normal infection fighting cells. Normal liver enzymes on labs and good kidney function. I accidentally ordered he lipid panel as a future order.  So it looks like she needs to come back fasting and get another blood draw.  Let the patient know I apologize about that I does click the wrong box in epic.

## 2017-07-15 NOTE — Telephone Encounter (Signed)
Pt scheduled appointment 07/17/17

## 2017-07-17 ENCOUNTER — Other Ambulatory Visit (INDEPENDENT_AMBULATORY_CARE_PROVIDER_SITE_OTHER): Payer: No Typology Code available for payment source

## 2017-07-17 DIAGNOSIS — Z0000 Encounter for general adult medical examination without abnormal findings: Secondary | ICD-10-CM | POA: Diagnosis not present

## 2017-07-17 LAB — LIPID PANEL
CHOL/HDL RATIO: 2
Cholesterol: 122 mg/dL (ref 0–200)
HDL: 65 mg/dL (ref >39.00)
LDL CALC: 51 mg/dL (ref 0–99)
NonHDL: 57.41
Triglycerides: 33 mg/dL (ref 0.0–149.0)
VLDL: 6.6 mg/dL (ref 0.0–40.0)

## 2017-07-30 ENCOUNTER — Ambulatory Visit (INDEPENDENT_AMBULATORY_CARE_PROVIDER_SITE_OTHER): Payer: No Typology Code available for payment source | Admitting: Medical

## 2017-07-30 ENCOUNTER — Encounter: Payer: Self-pay | Admitting: Medical

## 2017-07-30 VITALS — BP 110/59 | HR 57 | Temp 97.8°F | Resp 16 | Ht 67.0 in | Wt 135.8 lb

## 2017-07-30 DIAGNOSIS — H9201 Otalgia, right ear: Secondary | ICD-10-CM

## 2017-07-30 DIAGNOSIS — H9313 Tinnitus, bilateral: Secondary | ICD-10-CM

## 2017-07-30 DIAGNOSIS — H6123 Impacted cerumen, bilateral: Secondary | ICD-10-CM | POA: Diagnosis not present

## 2017-07-30 MED ORDER — FLUTICASONE PROPIONATE 50 MCG/ACT NA SUSP: 2.0000 | Freq: Every day | NASAL | 1 refills | Status: DC

## 2017-07-30 MED ORDER — AMOXICILLIN-POT CLAVULANATE 875-125 MG PO TABS: 1.0000 | ORAL_TABLET | Freq: Two times a day (BID) | ORAL | 0 refills | Status: DC

## 2017-07-30 MED FILL — AMOX-CLAV 875-125 MG TABLET: 875-125 | 10 days supply | Qty: 20 | Fill #0

## 2017-07-30 NOTE — Progress Notes (Signed)
Subjective:    Patient ID: Stacy Saunders, female    DOB: 1985-08-25, 32 y.o.   MRN: [id number]  HPI   Pt in with some ear pain recently with some buzzing and humming noise. Last 24 hours pain has been present. Faint buzzing noise preceded ear pain for about 2 weeks. Pt denies any obvious nasal congestion. No sneezing.  Pt states hx of ear infections randomly apart from URI or allergies in the past.     Review of Systems  Constitutional: Negative for chills, fatigue and fever.  HENT: Positive for ear pain. Negative for congestion, nosebleeds, postnasal drip, sinus pressure and sneezing.        Some bilateral  tinnitus intermittent over past about one month. Worse over past 2 weeks.   But not present on exam.  Respiratory: Negative for cough, chest tightness, shortness of breath and wheezing.   Cardiovascular: Negative for chest pain and palpitations.  Gastrointestinal: Negative for abdominal pain.  Genitourinary: Negative for difficulty urinating, dysuria, pelvic pain and urgency.  Musculoskeletal: Negative for back pain.  Skin: Negative for pallor and rash.  Neurological: Negative for dizziness and headaches.  Hematological: Negative for adenopathy. Does not bruise/bleed easily.  Psychiatric/Behavioral: Negative for behavioral problems and dysphoric mood.    Past Medical History:  Diagnosis Date  . Asthma    exercise induced  . UTI (lower urinary tract infection)      Social History   Socioeconomic History  . Marital status: Single    Spouse name: Not on file  . Number of children: Not on file  . Years of education: Not on file  . Highest education level: Not on file  Social Needs  . Financial resource strain: Not on file  . Food insecurity - worry: Not on file  . Food insecurity - inability: Not on file  . Transportation needs - medical: Not on file  . Transportation needs - non-medical: Not on file  Occupational History  . Not on file  Tobacco Use  .  Smoking status: Never Smoker  . Smokeless tobacco: Never Used  Substance and Sexual Activity  . Alcohol use: Yes  . Drug use: No  . Sexual activity: Yes  Other Topics Concern  . Not on file  Social History Narrative  . Not on file    No past surgical history on file.  Family History  Problem Relation Age of Onset  . Hypertension Mother   . Hyperlipidemia Mother   . Glaucoma Father   . Aneurysm Maternal Grandmother     No Known Allergies  Current Outpatient Medications on File Prior to Visit  Medication Sig Dispense Refill  . fluticasone (FLONASE) 50 MCG/ACT nasal spray Place 2 sprays into both nostrils daily. 16 g 1   No current facility-administered medications on file prior to visit.     BP (!) 110/59   Pulse (!) 57   Temp 97.8 F (36.6 C) (Oral)   Resp 16   Ht 5\' 7"  (1.702 m)   Wt 135 lb 12.8 oz (61.6 kg)   SpO2 100%   BMI 21.27 kg/m       Objective:   Physical Exam  General  Mental Status - Alert. General Appearance - Well groomed. Not in acute distress.  Skin Rashes- No Rashes.  HEENT Head- Normal. Ear Tympanic membrane- Left- Normal. Right -could not see full TMtympanic canal- Left-mild wax present right-moderate wax present blocking about 60% of the canal.  Very small portion of TM  was visible.  That small portion did look normal. (Note post lavage left canal was clear completely.  Right canal was not attempted due to patient's concern regarding her pain.) Eye Sclera/Conjunctiva- Left- Normal. Right- Normal. Nose & Sinuses Nasal Mucosa- Left-  Boggy and Congested. Right-  Boggy and  Congested.Bilateral no maxillary and no frontal sinus pressure. Mouth & Throat Lips: Upper Lip- Normal: no dryness, cracking, pallor, cyanosis, or vesicular eruption. Lower Lip-Normal: no dryness, cracking, pallor, cyanosis or vesicular eruption. Buccal Mucosa- Bilateral- No Aphthous ulcers. Oropharynx- No Discharge or Erythema. Tonsils: Characteristics- Bilateral- No  Erythema or Congestion. Size/Enlargement- Bilateral- No enlargement. Discharge- bilateral-None.  Neck Neck- Supple. No Masses.   Chest and Lung Exam Auscultation: Breath Sounds:-Clear even and unlabored.  Cardiovascular Auscultation:Rythm- Regular, rate and rhythm. Murmurs & Other Heart Sounds:Ausculatation of the heart reveal- No Murmurs.  Lymphatic Head & Neck General Head & Neck Lymphatics: Bilateral: Description- No Localized lymphadenopathy.       Assessment & Plan:   With your recent ear pain and history of otitis media, I do think it is best to go ahead and give you Augmentin antibiotic.  We will see if your pain subsides.  Due to wax present today I was unable to fully visualize TMs.    Lavage of left canal was successful but right canal patient declined lavage due to concern about pain..  Would recommend that she use Debrox over-the-counter for 5 days preceding follow-up appointment.  Then at that point if no ear pain we will attempt re-lavage.  I do think Flonase would be beneficial presently to decrease any potential pressure in the eustachian tube.  If your tinnitus does not resolve after above treatment then will refer to ENT   Follow-up in 10 days or as needed. Esperanza RichtersEdward Kano Heckmann, PA-C

## 2017-07-30 NOTE — Patient Instructions (Addendum)
With your recent ear pain and history of otitis media, I do think it is best to go ahead and give you Augmentin antibiotic.  We will see if your pain subsides.  Due to wax present today I was unable to fully visualize TMs.    Lavage of left canal was successful but right canal patient declined lavage due to concern about pain..  Would recommend that she use Debrox over-the-counter for 5 days preceding follow-up appointment.  Then at that point if no ear pain we will attempt re-lavage.  I do think Flonase would be beneficial presently to decrease any potential pressure in the eustachian tube.  If your tinnitus does not resolve after above treatment then will refer to ENT   Follow-up in 10 days or as needed.

## 2017-09-05 ENCOUNTER — Ambulatory Visit
Admission: EM | Admit: 2017-09-05 | Discharge: 2017-09-05 | Disposition: A | Discharge status: Home / Self Care | Payer: No Typology Code available for payment source | Attending: Family Medicine | Admitting: Family Medicine

## 2017-09-05 ENCOUNTER — Ambulatory Visit: Payer: Self-pay | Admitting: *Deleted

## 2017-09-05 ENCOUNTER — Other Ambulatory Visit: Payer: Self-pay

## 2017-09-05 DIAGNOSIS — J029 Acute pharyngitis, unspecified: Secondary | ICD-10-CM | POA: Diagnosis not present

## 2017-09-05 DIAGNOSIS — R69 Illness, unspecified: Secondary | ICD-10-CM

## 2017-09-05 DIAGNOSIS — R509 Fever, unspecified: Secondary | ICD-10-CM

## 2017-09-05 DIAGNOSIS — M791 Myalgia, unspecified site: Secondary | ICD-10-CM | POA: Diagnosis not present

## 2017-09-05 DIAGNOSIS — J111 Influenza due to unidentified influenza virus with other respiratory manifestations: Secondary | ICD-10-CM

## 2017-09-05 LAB — RAPID INFLUENZA A&B ANTIGENS
Influenza A (ARMC): NEGATIVE
Influenza B (ARMC): NEGATIVE

## 2017-09-05 MED ORDER — OSELTAMIVIR PHOSPHATE 75 MG PO CAPS: 75.0000 mg | ORAL_CAPSULE | Freq: Two times a day (BID) | ORAL | 0 refills | Status: DC

## 2017-09-05 MED ORDER — ACETAMINOPHEN 500 MG PO TABS
1000.0000 mg | ORAL_TABLET | Freq: Once | ORAL | Status: AC
  Administered 2017-09-05: 1000 mg via ORAL

## 2017-09-05 NOTE — ED Triage Notes (Signed)
Pt reports starting yesterday with body aches, chills, fever, sore throat and congestion.

## 2017-09-05 NOTE — Telephone Encounter (Signed)
Called in c/o sore throat, body aches, chills, decreased appetite, fever 100.6, non productive cough all started last night.  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    She informed me she is going to the urgent care center close to her house because she lives in [place], [place] and does not feel like driving into [place].       Reason for Disposition . Cold with no complications  Answer Assessment - Initial Assessment Questions 1. ONSET: "When did the nasal discharge start?"      Last night around 11:00 started.    Body aches, chills, sore throat, fever, chest tightness, dry cough.   Ibuprofen helped the fever. 2. AMOUNT: "How much discharge is there?"      Don't have a runny nose.   3. COUGH: "Do you have a cough?" If yes, ask: "Describe the color of your sputum" (clear, white, yellow, green)     Non productive 4. RESPIRATORY DISTRESS: "Describe your breathing."      When I have the chest tightness I feel short of breath.  I have induced asthma.   I also have chest congestion.     5. FEVER: "Do you have a fever?" If so, ask: "What is your temperature, how was it measured, and when did it start?"     Fever 100.6 at 1:30am.     Having body aches and chills. 6. SEVERITY: "Overall, how bad are you feeling right now?" (e.g., doesn't interfere with normal activities, staying home from school/work, staying in bed)      I worked last night.    I'm supposed to work [employer]arry [employer]ight. 7. OTHER SYMPTOMS: "Do you have any other symptoms?" (e.g., sore throat, earache, wheezing, vomiting)     None of the above.    Reduced appetite 8. PREGNANCY: "Is there any chance you are pregnant?" "When was your last menstrual period?"     No.   Last period ended a week ago.  Protocols used: COMMON COLD-A-AH

## 2017-09-05 NOTE — ED Provider Notes (Signed)
[facility]  CSN: [id number] Arrival date & time: 09/05/17  1309  History   Chief Complaint Chief Complaint  Patient presents with  . Sore Throat  . Generalized Body Aches   HPI  32 year old female presents with fever, chills, body aches, sore throat, and congestion.  Started abruptly last night.  Started with fever, chills, and diffuse body aches.  She now has sore throat and congestion in addition to her other symptoms.  She reports improvement in fever, chills, and body aches with ibuprofen.  However, her symptoms have recurred.  No reported sick contacts.  No known exacerbating factors.  No other associated symptoms.  No other complaints at this time.  Past Medical History:  Diagnosis Date  . Asthma    exercise induced  . UTI (lower urinary tract infection)    Patient Active Problem List   Diagnosis Date Noted  . Night sweats 01/30/2016  . Routine general medical examination at a health care facility 10/09/2013  . Exercise-induced asthma 10/09/2013   Surgical Hx - No past surgeries.   OB History   None    Home Medications    Prior to Admission medications   Medication Sig Start Date End Date Taking? Authorizing Provider  oseltamivir (TAMIFLU) 75 MG capsule Take 1 capsule (75 mg total) by mouth every 12 (twelve) hours. 09/05/17   Tommie Sams, DO   Family History Family History  Problem Relation Age of Onset  . Hypertension Mother   . Hyperlipidemia Mother   . Glaucoma Father   . Aneurysm Maternal Grandmother     Social History Social History   Tobacco Use  . Smoking status: Never Smoker  . Smokeless tobacco: Never Used  Substance Use Topics  . Alcohol use: Yes    Comment: social  . Drug use: No     Allergies   Patient has no known allergies.   Review of Systems Review of Systems  Constitutional: Positive for chills and fever.  HENT: Positive for congestion and sore throat.   Musculoskeletal:       Body aches.   Physical  Exam Triage Vital Signs ED Triage Vitals  Enc Vitals Group     BP 09/05/17 1325 120/63     Pulse Rate 09/05/17 1325 93     Resp 09/05/17 1325 16     Temp 09/05/17 1325 (!) 103 F (39.4 C)     Temp Source 09/05/17 1325 Oral     SpO2 09/05/17 1325 100 %     Weight 09/05/17 1326 132 lb (59.9 kg)     Height 09/05/17 1326 5' 7.5" (1.715 m)     Head Circumference --      Peak Flow --      Pain Score 09/05/17 1322 7     Pain Loc --      Pain Edu? --      Excl. in GC? --    Updated Vital Signs BP 120/63 (BP Location: Right Arm)   Pulse 93   Temp (!) 103 F (39.4 C) (Oral)   Resp 16   Ht 5' 7.5" (1.715 m)   Wt 132 lb (59.9 kg)   LMP 08/28/2017   SpO2 100%   BMI 20.37 kg/m   Physical Exam  Constitutional: She is oriented to person, place, and time. She appears well-developed.  Appears fatigued but in no acute distress.  HENT:  Head: Normocephalic and atraumatic.  Oropharynx with mild erythema.  Eyes: Conjunctivae are normal. Right eye exhibits  no discharge. Left eye exhibits no discharge.  Cardiovascular: Normal rate and regular rhythm.  Pulmonary/Chest: Effort normal and breath sounds normal. She has no wheezes. She has no rales.  Neurological: She is alert and oriented to person, place, and time.  Psychiatric: She has a normal mood and affect. Her behavior is normal.  Nursing note and vitals reviewed.  UC Treatments / Results  Labs (all labs ordered are listed, but only abnormal results are displayed) Labs Reviewed  RAPID INFLUENZA A&B ANTIGENS ([facility] ONLY)    EKG None Radiology No results found.  Procedures Procedures (including critical care time)  Medications Ordered in UC Medications  acetaminophen (TYLENOL) tablet 1,000 mg (1,000 mg Oral Given 09/05/17 1331)     Initial Impression / Assessment and Plan / UC Course  I have reviewed the triage vital signs and the nursing notes.  Pertinent labs & imaging results that were available during my care of the  patient were reviewed by me and considered in my medical decision making (see chart for details).     32 year old female presents with suspected influenza.  Her influenza testing was negative.  However, clinically appears to have influenza.  Treated with Tamiflu.  Advised Tylenol & Motrin as needed.  Out of work.  Final Clinical Impressions(s) / UC Diagnoses   Final diagnoses:  Influenza-like illness    ED Discharge Orders        Ordered    oseltamivir (TAMIFLU) 75 MG capsule  Every 12 hours     09/05/17 1359     Controlled Substance Prescriptions [place] Controlled Substance Registry consulted? Not Applicable   Tommie SamsCook, Aryel Edelen G, DO 09/05/17 1437

## 2017-09-05 NOTE — Discharge Instructions (Signed)
Rest, tylenol/motrin.  Tamiflu as prescribed.  Take care  Dr. Adriana Simasook

## 2017-09-07 ENCOUNTER — Ambulatory Visit
Admission: EM | Admit: 2017-09-07 | Discharge: 2017-09-07 | Disposition: A | Discharge status: Home / Self Care | Payer: No Typology Code available for payment source | Attending: Family Medicine | Admitting: Family Medicine

## 2017-09-07 ENCOUNTER — Encounter: Payer: Self-pay | Admitting: Gynecology

## 2017-09-07 DIAGNOSIS — J029 Acute pharyngitis, unspecified: Secondary | ICD-10-CM | POA: Diagnosis not present

## 2017-09-07 LAB — RAPID STREP SCREEN (MED CTR MEBANE ONLY): STREPTOCOCCUS, GROUP A SCREEN (DIRECT): NEGATIVE

## 2017-09-07 MED ORDER — AMOXICILLIN 875 MG PO TABS: 875.0000 mg | ORAL_TABLET | Freq: Two times a day (BID) | ORAL | 0 refills | Status: DC

## 2017-09-07 NOTE — Discharge Instructions (Addendum)
Please alternate Tylenol and ibuprofen as needed for sore throat pain.  Make sure you are drinking lots of fluids.  Take antibiotics as prescribed.  Return to the clinic for any fevers, inability to swallow, worsening symptoms or to changes in your health.

## 2017-09-07 NOTE — ED Provider Notes (Signed)
[facility]    CSN: [id number] Arrival date & time: 09/07/17  3086     History   Chief Complaint Chief Complaint  Patient presents with  . Sore Throat    HPI Stacy Saunders is a 32 y.o. female.   Presents to the urgent care facility for evaluation of sore throat.  Sore throat is been present for 2 days.  She describes severe sore throat pain with no fevers, headaches.  She has not had much of a cough.  She denies any posterior nasal drainage.  This morning she noticed white patches on her throat.  She denies any nausea vomiting, abdominal pain.  HPI  Past Medical History:  Diagnosis Date  . Asthma    exercise induced  . UTI (lower urinary tract infection)     Patient Active Problem List   Diagnosis Date Noted  . Night sweats 01/30/2016  . Routine general medical examination at a health care facility 10/09/2013  . Exercise-induced asthma 10/09/2013    History reviewed. No pertinent surgical history.  OB History   None      Home Medications    Prior to Admission medications   Medication Sig Start Date End Date Taking? Authorizing Provider  oseltamivir (TAMIFLU) 75 MG capsule Take 1 capsule (75 mg total) by mouth every 12 (twelve) hours. 09/05/17  Yes Cook, Jayce G, DO  amoxicillin (AMOXIL) 875 MG tablet Take 1 tablet (875 mg total) by mouth 2 (two) times daily. X 10 days 09/07/17   Evon Slack, PA-C    Family History Family History  Problem Relation Age of Onset  . Hypertension Mother   . Hyperlipidemia Mother   . Glaucoma Father   . Aneurysm Maternal Grandmother     Social History Social History   Tobacco Use  . Smoking status: Never Smoker  . Smokeless tobacco: Never Used  Substance Use Topics  . Alcohol use: Yes    Comment: social  . Drug use: No     Allergies   Patient has no known allergies.   Review of Systems Review of Systems  Constitutional: Positive for chills. Negative for fever.  HENT: Positive for sore throat.  Negative for congestion, ear discharge, rhinorrhea, sinus pressure, sinus pain, trouble swallowing and voice change.   Respiratory: Negative for cough, shortness of breath, wheezing and stridor.   Cardiovascular: Negative for chest pain.  Gastrointestinal: Negative for abdominal pain, diarrhea, nausea and vomiting.  Genitourinary: Negative for dysuria, flank pain and pelvic pain.  Musculoskeletal: Negative for back pain and myalgias.  Skin: Negative for rash.  Neurological: Negative for dizziness and headaches.     Physical Exam Triage Vital Signs ED Triage Vitals  Enc Vitals Group     BP 09/07/17 0836 114/66     Pulse Rate 09/07/17 0836 68     Resp 09/07/17 0836 16     Temp 09/07/17 0836 97.8 F (36.6 C)     Temp Source 09/07/17 0836 Oral     SpO2 09/07/17 0836 100 %     Weight 09/07/17 0834 132 lb (59.9 kg)     Height --      Head Circumference --      Peak Flow --      Pain Score 09/07/17 0834 4     Pain Loc --      Pain Edu? --      Excl. in GC? --    No data found.  Updated Vital Signs BP 114/66 (BP  Location: Left Arm)   Pulse 68   Temp 97.8 F (36.6 C) (Oral)   Resp 16   Wt 132 lb (59.9 kg)   LMP 08/28/2017   SpO2 100%   BMI 20.37 kg/m   Visual Acuity Right Eye Distance:   Left Eye Distance:   Bilateral Distance:    Right Eye Near:   Left Eye Near:    Bilateral Near:     Physical Exam  Constitutional: She is oriented to person, place, and time. She appears well-developed and well-nourished. No distress.  HENT:  Head: Normocephalic and atraumatic.  Right Ear: Hearing, tympanic membrane, external ear and ear canal normal.  Left Ear: Hearing, tympanic membrane, external ear and ear canal normal.  Nose: Rhinorrhea present.  Mouth/Throat: Uvula is midline and mucous membranes are normal. No trismus in the jaw. No uvula swelling. Posterior oropharyngeal erythema present. No oropharyngeal exudate, posterior oropharyngeal edema or tonsillar abscesses. No  tonsillar exudate.  Eyes: Conjunctivae are normal. Right eye exhibits no discharge. Left eye exhibits no discharge.  Neck: Normal range of motion.  Cardiovascular: Normal rate and regular rhythm.  Pulmonary/Chest: Effort normal and breath sounds normal. No stridor. No respiratory distress. She has no wheezes. She has no rales.  Abdominal: Soft. She exhibits no distension. There is no tenderness.  Musculoskeletal: Normal range of motion. She exhibits no deformity.  Lymphadenopathy:    She has cervical adenopathy.  Neurological: She is alert and oriented to person, place, and time. She has normal reflexes.  Skin: Skin is warm and dry.  Psychiatric: She has a normal mood and affect. Her behavior is normal. Thought content normal.     UC Treatments / Results  Labs (all labs ordered are listed, but only abnormal results are displayed) Labs Reviewed  RAPID STREP SCREEN ([facility] ONLY)  CULTURE, GROUP A STREP [facility])    EKG None Radiology No results found.  Procedures Procedures (including critical care time)  Medications Ordered in UC Medications - No data to display   Initial Impression / Assessment and Plan / UC Course  I have reviewed the triage vital signs and the nursing notes.  Pertinent labs & imaging results that were available during my care of the patient were reviewed by me and considered in my medical decision making (see chart for details).     32 year old female with exudative pharyngitis.  Rapid strep test negative.  Due to history and exam we will go ahead and treat with amoxicillin.  Patient understands signs symptoms return to clinic for.  Final Clinical Impressions(s) / UC Diagnoses   Final diagnoses:  Sore throat    ED Discharge Orders        Ordered    amoxicillin (AMOXIL) 875 MG tablet  2 times daily     09/07/17 0857        Evon Slack, PA-C 09/07/17 [phone]

## 2017-09-07 NOTE — ED Triage Notes (Signed)
Patient c/o sore throat. 

## 2017-09-09 ENCOUNTER — Encounter: Payer: Self-pay | Admitting: Medical

## 2017-09-09 ENCOUNTER — Telehealth: Payer: Self-pay

## 2017-09-09 ENCOUNTER — Ambulatory Visit (INDEPENDENT_AMBULATORY_CARE_PROVIDER_SITE_OTHER): Payer: No Typology Code available for payment source | Admitting: Medical

## 2017-09-09 ENCOUNTER — Telehealth: Payer: Self-pay | Admitting: Medical

## 2017-09-09 VITALS — BP 115/75 | HR 54 | Temp 98.2°F | Resp 16 | Ht 67.0 in | Wt 132.6 lb

## 2017-09-09 DIAGNOSIS — R5383 Other fatigue: Secondary | ICD-10-CM | POA: Diagnosis not present

## 2017-09-09 DIAGNOSIS — J029 Acute pharyngitis, unspecified: Secondary | ICD-10-CM

## 2017-09-09 DIAGNOSIS — H9312 Tinnitus, left ear: Secondary | ICD-10-CM | POA: Diagnosis not present

## 2017-09-09 LAB — CBC WITH DIFFERENTIAL/PLATELET
BASOS PCT: 0.6 % (ref 0.0–3.0)
Basophils Absolute: 0 10*3/uL (ref 0.0–0.1)
EOS ABS: 0 10*3/uL (ref 0.0–0.7)
Eosinophils Relative: 0.5 % (ref 0.0–5.0)
HEMATOCRIT: 42.8 % (ref 36.0–46.0)
Hemoglobin: 14.4 g/dL (ref 12.0–15.0)
LYMPHS PCT: 29 % (ref 12.0–46.0)
Lymphs Abs: 1.4 10*3/uL (ref 0.7–4.0)
MCHC: 33.7 g/dL (ref 30.0–36.0)
MCV: 93 fl (ref 78.0–100.0)
MONOS PCT: 11.2 % (ref 3.0–12.0)
Monocytes Absolute: 0.5 10*3/uL (ref 0.1–1.0)
NEUTROS ABS: 2.7 10*3/uL (ref 1.4–7.7)
Neutrophils Relative %: 58.7 % (ref 43.0–77.0)
PLATELETS: 263 10*3/uL (ref 150.0–400.0)
RBC: 4.6 Mil/uL (ref 3.87–5.11)
RDW: 13 % (ref 11.5–15.5)
WBC: 4.7 10*3/uL (ref 4.0–10.5)

## 2017-09-09 MED ORDER — MAGIC MOUTHWASH
ORAL | 0 refills | Status: DC
Start: 2017-09-09 — End: 2017-12-25

## 2017-09-09 MED ORDER — AZITHROMYCIN 250 MG PO TABS: ORAL_TABLET | ORAL | 0 refills | Status: DC

## 2017-09-09 MED FILL — KETOCONAZOLE 2% SHAMPOO: 2 | 30 days supply | Qty: 120 | Fill #2

## 2017-09-09 MED FILL — CMPD-LIDO/QDRYL/MAALOX1:1:1: 10 days supply | Qty: 200 | Fill #0

## 2017-09-09 NOTE — Telephone Encounter (Signed)
I had considered zpack but wanted to wait until her labs done today were back. Then prescribe it.

## 2017-09-09 NOTE — Telephone Encounter (Signed)
Rx a azithromycin sent to patient's pharmacy at her request.  Please see should be see result note today as well.

## 2017-09-09 NOTE — Telephone Encounter (Signed)
Copied from CRM [id number]. Topic: General - Other >> Sep 09, 2017  3:56 PM Percival Spanish wrote:  Pt was given the following med amoxicillin (AMOXIL) 875 MG tablet at the Urgent Care on 09/07/17. She saw Saguier today and he told her if the Amoxicillin does not work to call back well she calling back to say it is not working.  She want him to call her in another antibiotic  Pt is asking for  [facility] [address] [place]

## 2017-09-09 NOTE — Patient Instructions (Signed)
For your recent moderate to severe pharyngitis type symptoms, I want you to continue the amoxicillin.  Your send out culture was reintubated for better growth on lab review.  Since your symptoms are still present, I think it would be beneficial to get a CBC and Epstein-Barr panel.  We will follow these above studies.  If everything is negative but clinically you still have sore throat then considering giving a azithromycin antibiotic.  I did prescribe Magic mouthwash today to use as directed swish and spit 4 times daily as needed.  I did also go ahead and make a referral to ENT for your persisting left side tinnitus.  Follow-up in 7 to 10 days or as needed.

## 2017-09-09 NOTE — Progress Notes (Signed)
Subjective:    Patient ID: Stacy Saunders, female    DOB: 1985-09-14, 32 y.o.   MRN: [id number]  HPI  Pt in for follow up from UC.  She states on Thursday had fever, 103, body aches and chills. Flu test was negative but put on tamiflu.  Friday st moderate to severe. She went to UC and had rapid strep that was negative but they gave her amoxicillin for probable strep. Currently as of today culture results states reincubated for better growth.  Pt states her throat pain level has not come down. About 8/10.   Pt fevers, chills and bodyaches resolved.  Review of Systems  Constitutional: Positive for fatigue.  HENT: Positive for sore throat. Negative for congestion, ear discharge, ear pain, trouble swallowing and voice change.        Also some persisting tinnitus which she mentioned today. Seen last time for this.  Cardiovascular: Negative for chest pain and palpitations.  Gastrointestinal: Negative for abdominal pain.  Musculoskeletal: Negative for arthralgias, gait problem and neck pain.  Skin: Negative for rash.  Neurological: Negative for dizziness, seizures, weakness and headaches.  Hematological: Positive for adenopathy. Does not bruise/bleed easily.  Psychiatric/Behavioral: Negative for hallucinations.    Past Medical History:  Diagnosis Date  . Asthma    exercise induced  . UTI (lower urinary tract infection)      Social History   Socioeconomic History  . Marital status: Single    Spouse name: Not on file  . Number of children: Not on file  . Years of education: Not on file  . Highest education level: Not on file  Occupational History  . Not on file  Social Needs  . Financial resource strain: Not on file  . Food insecurity:    Worry: Not on file    Inability: Not on file  . Transportation needs:    Medical: Not on file    Non-medical: Not on file  Tobacco Use  . Smoking status: Never Smoker  . Smokeless tobacco: Never Used  Substance and Sexual Activity    . Alcohol use: Yes    Comment: social  . Drug use: No  . Sexual activity: Yes  Lifestyle  . Physical activity:    Days per week: Not on file    Minutes per session: Not on file  . Stress: Not on file  Relationships  . Social connections:    Talks on phone: Not on file    Gets together: Not on file    Attends religious service: Not on file    Active member of club or organization: Not on file    Attends meetings of clubs or organizations: Not on file    Relationship status: Not on file  . Intimate partner violence:    Fear of current or ex partner: Not on file    Emotionally abused: Not on file    Physically abused: Not on file    Forced sexual activity: Not on file  Other Topics Concern  . Not on file  Social History Narrative  . Not on file    No past surgical history on file.  Family History  Problem Relation Age of Onset  . Hypertension Mother   . Hyperlipidemia Mother   . Glaucoma Father   . Aneurysm Maternal Grandmother     No Known Allergies  Current Outpatient Medications on File Prior to Visit  Medication Sig Dispense Refill  . amoxicillin (AMOXIL) 875 MG tablet Take 1 tablet (  875 mg total) by mouth 2 (two) times daily. X 10 days 20 tablet 0  . oseltamivir (TAMIFLU) 75 MG capsule Take 1 capsule (75 mg total) by mouth every 12 (twelve) hours. 10 capsule 0   No current facility-administered medications on file prior to visit.     BP 115/75   Pulse (!) 54   Temp 98.2 F (36.8 C) (Oral)   Resp 16   Ht  (1.702 m)   Wt 132 lb 9.6 oz (60.1 kg)   LMP 08/28/2017   SpO2 100%   BMI 20.77 kg/m       Objective:   Physical Exam  General  Mental Status - Alert. General Appearance - Well groomed. Not in acute distress.  Skin Rashes- No Rashes.  HEENT Head- Normal. Ear Auditory Canal - Left- Normal. Right - Normal.Tympanic Membrane- Left- Normal. Right- Normal. Eye Sclera/Conjunctiva- Left- Normal. Right- Normal. Nose & Sinuses Nasal Mucosa-  Left-  Boggy and Congested. Right-  Boggy and  Congested.Bilateral  No maxillary and no  frontal sinus pressure. Mouth & Throat Lips: Upper Lip- Normal: no dryness, cracking, pallor, cyanosis, or vesicular eruption. Lower Lip-Normal: no dryness, cracking, pallor, cyanosis or vesicular eruption. Buccal Mucosa- Bilateral- No Aphthous ulcers. Oropharynx- No Discharge or Erythema. Tonsils: Characteristics- Bilateral- Erythema  Size/Enlargement- Bilateral- 1+ enlargement. Discharge- bilateral-None presently.(possible faint apthous ulcers present)  Neck Neck- Supple. No Masses. Left side submandibular node lymphadenopathy.   Chest and Lung Exam Auscultation: Breath Sounds:-Clear even and unlabored.  Cardiovascular Auscultation:Rythm- Regular, rate and rhythm. Murmurs & Other Heart Sounds:Ausculatation of the heart reveal- No Murmurs.  Lymphatic Head & Neck General Head & Neck Lymphatics: Bilateral: Description- No Localized lymphadenopathy.  Abdomen-soft, nontender, nondistended, positive bowel sounds.  No organomegaly.  No splenomegaly.          Assessment & Plan:  For your recent moderate to severe pharyngitis type symptoms, I want you to continue the amoxicillin.  Your send out culture was reintubated for better growth on lab review.  Since your symptoms are still present, I think it would be beneficial to get a CBC and Epstein-Barr panel.  We will follow these above studies.  If everything is negative but clinically you still have sore throat then considering giving a azithromycin antibiotic.  I did prescribe Magic mouthwash today to use as directed swish and spit 4 times daily as needed.  I did also go ahead and make a referral to ENT for your persisting left side tinnitus.  Follow-up in 7 to 10 days or as needed.  Esperanza Richters, PA-C

## 2017-09-10 LAB — EPSTEIN-BARR VIRUS VCA ANTIBODY PANEL
EBV NA IgG: 432 U/mL — ABNORMAL HIGH
EBV VCA IgG: >750 U/mL — ABNORMAL HIGH
EBV VCA IgM: <36 U/mL

## 2017-09-10 LAB — CULTURE, GROUP A STREP (THRC)

## 2017-09-10 MED FILL — AZITHROMYCIN 250 MG TABS: 250 | 5 days supply | Qty: 6 | Fill #0

## 2017-09-10 NOTE — Progress Notes (Signed)
Negative strep culture.

## 2017-09-18 ENCOUNTER — Telehealth: Payer: Self-pay

## 2017-09-18 NOTE — Telephone Encounter (Signed)
Copied from CRM [id number]. Topic: General - Other >> Sep 17, 2017  3:36 PM Marylen Ponto wrote: Reason for CRM: Pt called in requesting a referral for an in network physician for ear nose and throat. Pt states she would like the referral to be sent to [facility] [address] [place] [address].

## 2017-09-19 ENCOUNTER — Telehealth: Payer: Self-pay | Admitting: Medical

## 2017-09-19 NOTE — Telephone Encounter (Signed)
Pt wants to be referred to [facility]. Looks like you started referral. But no appointment yet. Pt sent message asking for referral to in network provider.

## 2017-09-19 NOTE — Telephone Encounter (Signed)
Referral to ENT already placed. Will you get her in with [facility]. See that referral please.

## 2017-09-20 NOTE — Telephone Encounter (Signed)
Referral changed to [facility]

## 2017-10-15 IMAGING — US US SOFT TISSUE HEAD/NECK
1 series · 12 of 25 positions shown · non-contrast
Comparison: None.

CLINICAL DATA: Palpable abnormality.  Thyromegaly.

EXAM:
THYROID ULTRASOUND
TECHNIQUE: Ultrasound examination of the thyroid gland and adjacent soft
tissues was performed.

[Series 1: us soft tissue head/neck · 0.04mm/px · 12 of 101 slices shown]
[im 5/101]
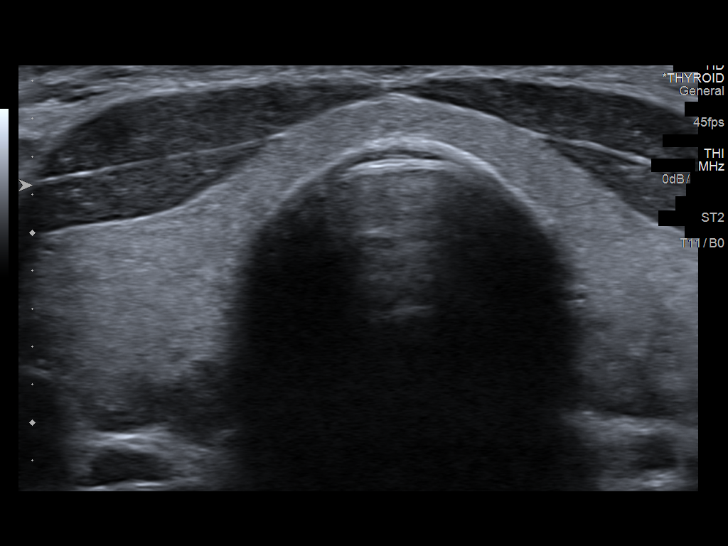
[im 13/101]
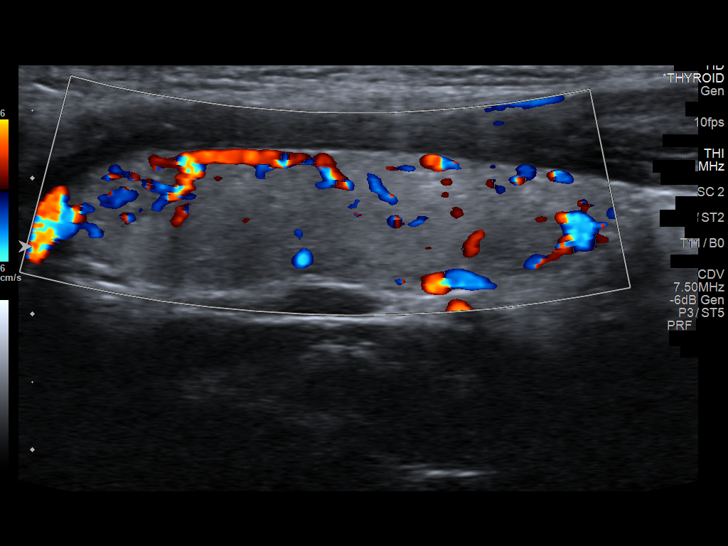
[im 21/101]
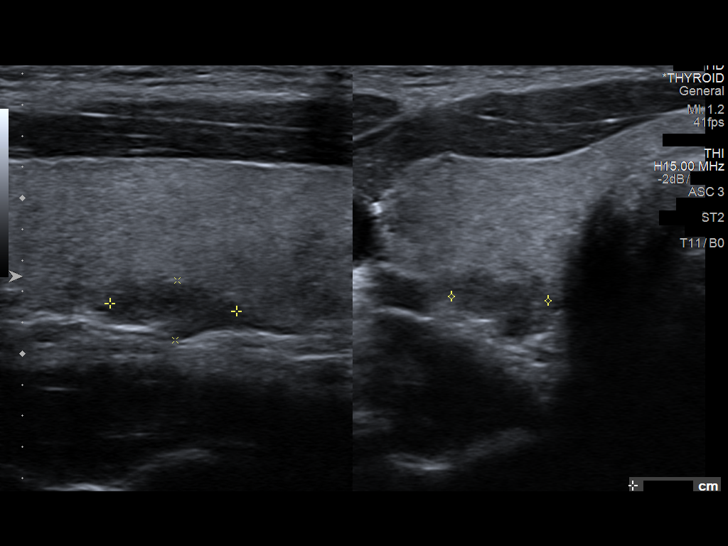
[im 30/101]
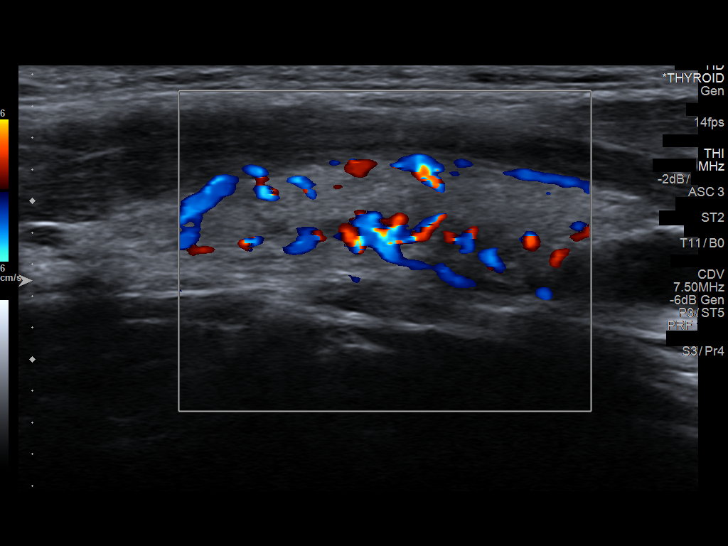
[im 38/101]
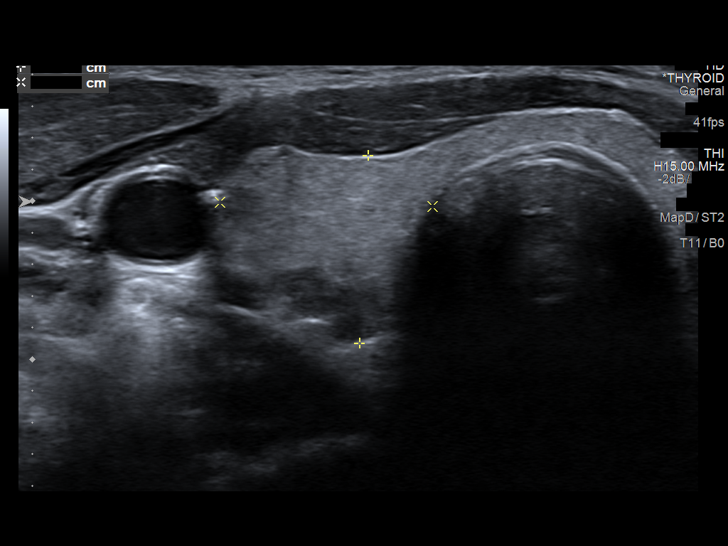
[im 46/101]
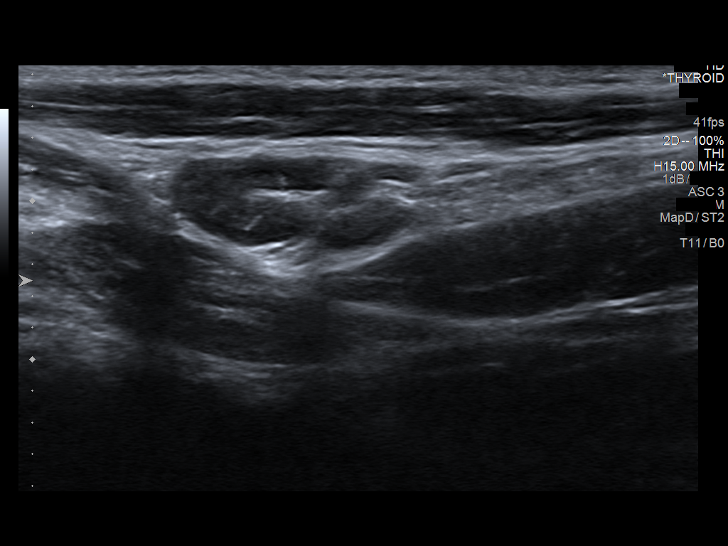
[im 55/101]
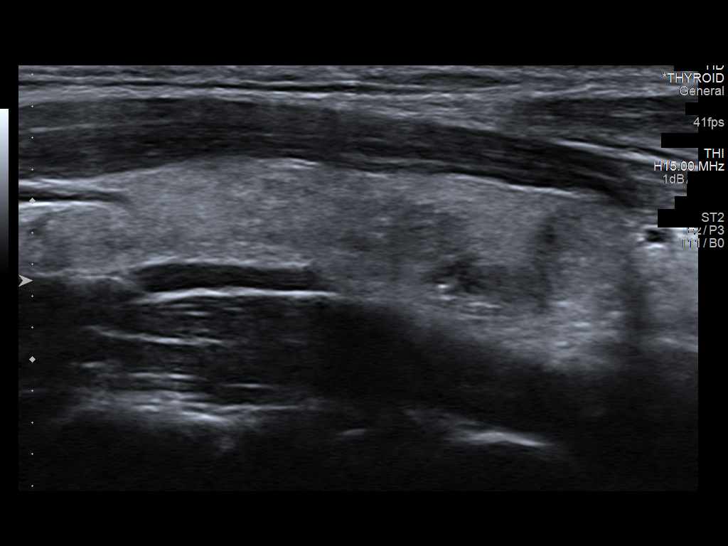
[im 63/101]
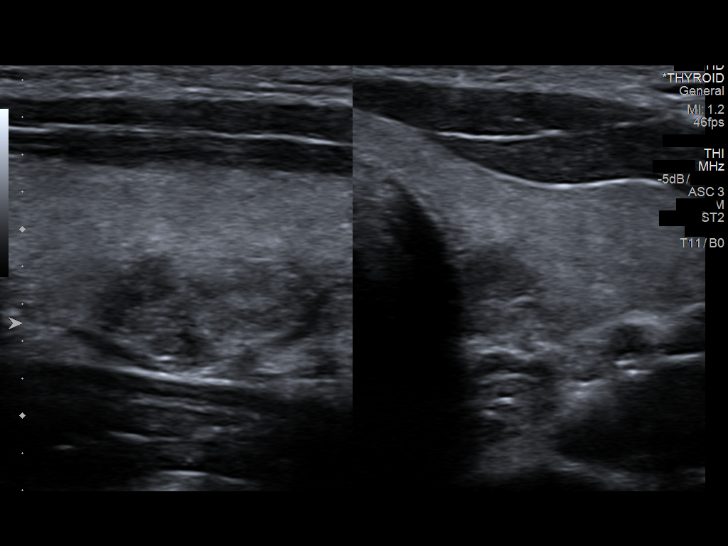
[im 71/101]
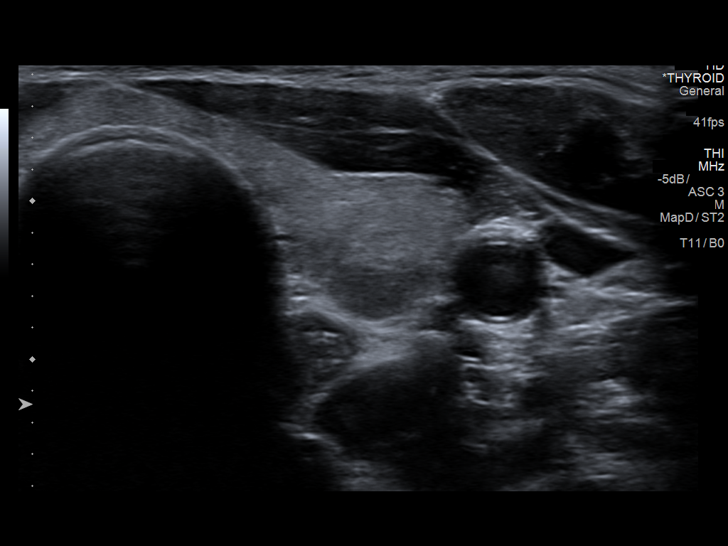
[im 80/101]
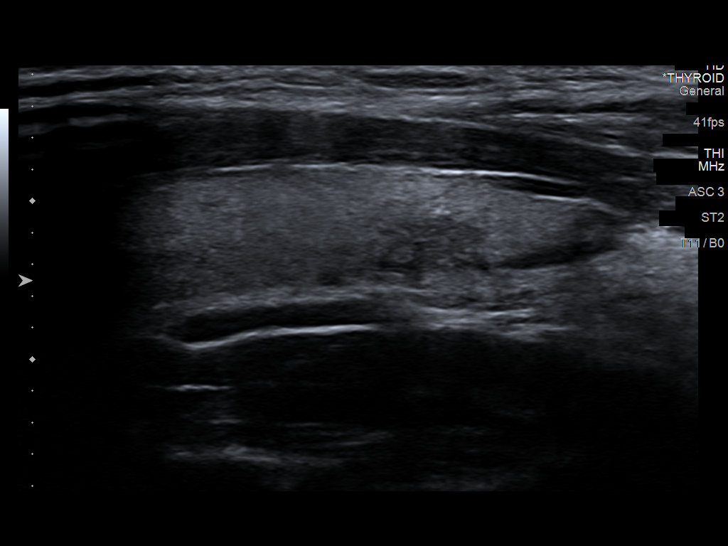
[im 88/101]
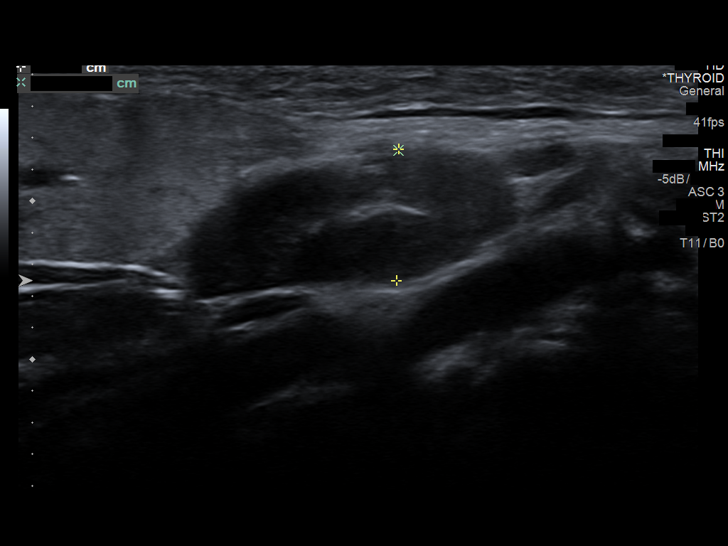
[im 96/101]
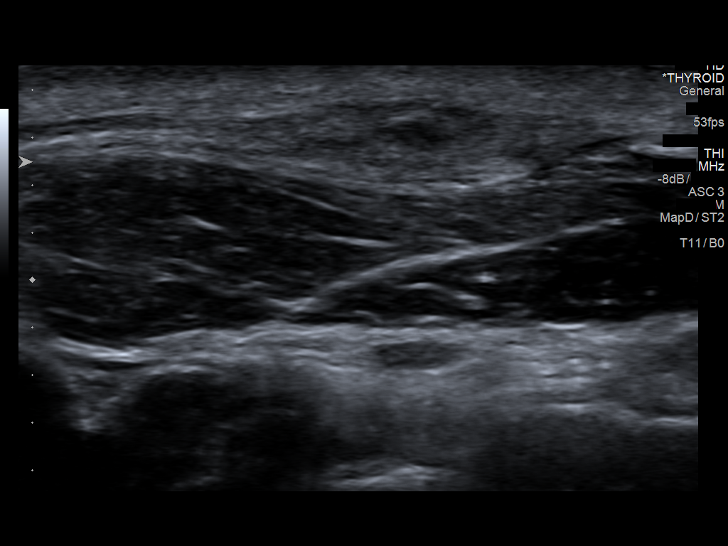

[12 of 25 positions shown; findings below may reference images not displayed]

FINDINGS: Parenchymal Echotexture: Mildly heterogenous

Isthmus: Normal in size measures 0.2 cm in diameter

Right lobe: Normal in size measuring 4.1 x 1.2 x 1.3 cm

Left lobe: Normal in size measuring 3.9 x 1.0 x 1.5 cm

_________________________________________________________

Estimated total number of nodules >/= 1 cm: 1

Number of spongiform nodules >/=  2 cm not described below (TR1): 0

Number of mixed cystic and solid nodules >/= 1.5 cm not described
below (TR2): 0

_________________________________________________________

Nodule # 1:

Location: Right; Superior

Maximum size: 0.6 cm; Other 2 dimensions: 0.6 x 0.5 cm

Composition: solid/almost completely solid (2)

Echogenicity: isoechoic (1)

Shape: not taller-than-wide (0)

Margins: ill-defined (0)

Echogenic foci: punctate echogenic foci (3)

ACR TI-RADS total points: 6.

ACR TI-RADS risk category: TR4 (4-6 points).

ACR TI-RADS recommendations:

Given size (<0.9 cm) and appearance, this nodule does NOT meet
TI-RADS criteria for biopsy or dedicated follow-up.

_________________________________________________________

Nodule # 2:

Location: Right; Mid

Maximum size: 0.8 cm; Other 2 dimensions: 0.6 x 0.4 cm

Composition: solid/almost completely solid (2)

Echogenicity: hypoechoic (2)

Shape: not taller-than-wide (0)

Margins: lobulated/irregular (2)

Echogenic foci: none (0)

ACR TI-RADS total points: 6.

ACR TI-RADS risk category: TR4 (4-6 points).

ACR TI-RADS recommendations:

Given size (<0.9 cm) and appearance, this nodule does NOT meet
TI-RADS criteria for biopsy or dedicated follow-up.

_________________________________________________________

Nodule # 3:

Location: Left; Mid

Maximum size: 1.1 cm; Other 2 dimensions: 0.6 x 0.5 cm

Composition: solid/almost completely solid (2)

Echogenicity: hypoechoic (2)

Shape: not taller-than-wide (0)

Margins: ill-defined (0)

Echogenic foci: none (0)

ACR TI-RADS total points: 4.

ACR TI-RADS risk category: TR4 (4-6 points).

ACR TI-RADS recommendations:

*Given size (>/= 1 - 1.4 cm) and appearance, a follow-up ultrasound
in 1 year should be considered based on TI-RADS criteria.

_________________________________________________________

There is an additional punctate (approximately 0.5 cm) hypoechoic
nodule within the mid lateral aspect the right lobe of the thyroid
which is not meet imaging criteria to recommend percutaneous
sampling or dedicated follow-up.

There is are scattered punctate (sub 0.9 cm) hypoechoic nodules
scattered within the left lobe of the thyroid, none of which meet
imaging criteria to recommend percutaneous sampling or continued
dedicated follow-up.

Patient's palpable area of concern within the left side of the neck
appears to correlate with a ill-defined approximately 1.5 x 0.4 x 1
cm nodule/pseudo nodule within the subcutaneous tissues superficial
to the neck musculature.

Note is made of borderline enlarged bilateral cervical lymph nodes,
the right measuring 1 cm in diameter (image 43) and the left
measuring 0.8 cm (image 91). Note is also made of several adjacent
smaller (sub 6 mm) non pathologically enlarged cervical lymph nodes,
all of which appear to maintain benign fatty hila.
IMPRESSION: 1. Findings suggestive multinodular goiter.
2. Nodule #3 meets imaging criteria to recommend a 1 year follow-up.
3. Patient's palpable area of concern appears to correlate with an
approximately 1.5 cm nodule/pseudo nodule within the subcutaneous
tissues superficial to the neck musculature - this structure is
indeterminate on this examination and as note is also made of
borderline enlarged bilateral cervical lymph nodes, further
evaluation with contrast-enhanced neck CT could be performed as
indicated.
The above is in keeping with the ACR TI-RADS recommendations - [HOSPITAL] 1965;[DATE].

## 2017-12-25 ENCOUNTER — Ambulatory Visit (INDEPENDENT_AMBULATORY_CARE_PROVIDER_SITE_OTHER): Payer: Self-pay | Admitting: Nurse Practitioner

## 2017-12-25 VITALS — BP 95/65 | HR 94 | Temp 98.5°F | Resp 16 | Wt 135.8 lb

## 2017-12-25 DIAGNOSIS — H6983 Other specified disorders of Eustachian tube, bilateral: Secondary | ICD-10-CM

## 2017-12-25 DIAGNOSIS — H9202 Otalgia, left ear: Secondary | ICD-10-CM

## 2017-12-25 NOTE — Progress Notes (Signed)
   Subjective:    Patient ID: Stacy Saunders, female    DOB: 1986/04/11, 32 y.o.   MRN: [id number]  The patient is a 32 year old female who presents today for complaints of ear pain and ear fullness.  The patient states the pain is more so on the left side but states that she feels like both her ears are clogged.  Patient states she does have a history of recurrent ear infections and she normally uses drops for them.  The patient states she has not taken anything for her ear symptoms at this time as her medication is expired.  The patient states her pain right now is minimal rating it a 3 out of 10.  The patient denies any symptoms such as fevers, chills, upper respiratory symptoms such as cough, congestion, runny nose.  The patient denies any change in hearing or loss of hearing.   Review of Systems  Constitutional: Negative.   HENT: Positive for ear pain. Negative for ear discharge, postnasal drip, rhinorrhea, sinus pressure, sinus pain, sneezing and sore throat.   Eyes: Negative.   Respiratory: Negative.   Cardiovascular: Negative.   Skin: Negative.   Allergic/Immunologic: Negative.   Neurological: Negative.       Objective:   Physical Exam  Constitutional: She is oriented to person, place, and time. She appears well-developed and well-nourished. No distress.  HENT:  Head: Normocephalic and atraumatic.  Right Ear: External ear normal.  Left Ear: External ear normal.  Nose: Nose normal.  Mouth/Throat: Oropharynx is clear and moist. No oropharyngeal exudate.  Bilateral mucoid middle ear fluid.  Eyes: Pupils are equal, round, and reactive to light. Conjunctivae and EOM are normal.  Neck: Normal range of motion. Neck supple. No tracheal deviation present. No thyromegaly present.  Cardiovascular: Normal rate, regular rhythm and normal heart sounds.  Pulmonary/Chest: Effort normal and breath sounds normal.  Abdominal: Soft. Bowel sounds are normal.  Neurological: She is alert and  oriented to person, place, and time.  Skin: Skin is warm and dry. Capillary refill takes less than 2 seconds.  Psychiatric: She has a normal mood and affect. Her behavior is normal.  Vitals reviewed.     Assessment & Plan:  Exam findings, diagnosis etiology and medication use and indications reviewed with patient. Follow- Up and discharge instructions provided. No emergent/urgent issues found on exam.  Patient verbalized understanding of information provided and agrees with plan of care (POC), all questions answered.   1. Eustachian tube dysfunction, bilateral -Use Fluticasone steroid nasal spray, 2 sprays in each nostril daily for 10 days. -May use Zyrtec each morning until symptoms improve. -Take Ibuprofen 800mg  every 8 hours as needed for ear pain. -Warm compresses to the ears for comfort. -Follow up if symptoms do not improve. -Follow up in the ER if you have sudden change in hearing or loss of hearing.  2. Acute otalgia, left -Use Fluticasone steroid nasal spray, 2 sprays in each nostril daily for 10 days. -May use Zyrtec each morning until symptoms improve. -Take Ibuprofen 800mg  every 8 hours as needed for ear pain. -Warm compresses to the ears for comfort. -Follow up if symptoms do not improve. -Follow up in the ER if you have sudden change in hearing or loss of hearing.

## 2017-12-25 NOTE — Patient Instructions (Addendum)
Earache, Adult  -Use Fluticasone steroid nasal spray, 2 sprays in each nostril daily for 10 days. -May use Zyrtec each morning until symptoms improve. -Take Ibuprofen 800mg  every 8 hours as needed for ear pain. -Warm compresses to the ears for comfort. -Follow up if symptoms do not improve. -Follow up in the ER if you have sudden change in hearing or loss of hearing.  An earache, or ear pain, can be caused by many things, including:  An infection.  Ear wax buildup.  Ear pressure.  Something in the ear that should not be there (foreign body).  A sore throat.  Tooth problems.  Jaw problems.  Treatment of the earache will depend on the cause. If the cause is not clear or cannot be determined, you may need to watch your symptoms until your earache goes away or until a cause is found. Follow these instructions at home: Pay attention to any changes in your symptoms. Take these actions to help with your pain:  Take or apply over-the-counter and prescription medicines only as told by your health care provider.  If you were prescribed an antibiotic medicine, use it as told by your health care provider. Do not stop using the antibiotic even if you start to feel better.  Do not put anything in your ear other than medicine that is prescribed by your health care provider.  If directed, apply heat to the affected area as often as told by your health care provider. Use the heat source that your health care provider recommends, such as a moist heat pack or a heating pad. ? Place a towel between your skin and the heat source. ? Leave the heat on for 20-30 minutes. ? Remove the heat if your skin turns bright red. This is especially important if you are unable to feel pain, heat, or cold. You may have a greater risk of getting burned.  If directed, put ice on the ear: ? Put ice in a plastic bag. ? Place a towel between your skin and the bag. ? Leave the ice on for 20 minutes, 2-3 times a  day.  Try resting in an upright position instead of lying down. This may help to reduce pressure in your ear and relieve pain.  Chew gum if it helps to relieve your ear pain.  Treat any allergies as told by your health care provider.  Keep all follow-up visits as told by your health care provider. This is important.  Contact a health care provider if:  Your pain does not improve within 2 days.  Your earache gets worse.  You have new symptoms.  You have a fever. Get help right away if:  You have a severe headache.  You have a stiff neck.  You have trouble swallowing.  You have redness or swelling behind your ear.  You have fluid or blood coming from your ear.  You have hearing loss.  You feel dizzy. This information is not intended to replace advice given to you by your health care provider. Make sure you discuss any questions you have with your health care provider. Document Released: 12/16/2003 Document Revised: 12/27/2015 Document Reviewed: 10/24/2015 Elsevier Interactive Patient Education  2018 Elsevier Inc.  Eustachian Tube Dysfunction The eustachian tube connects the middle ear to the back of the nose. It regulates air pressure in the middle ear by allowing air to move between the ear and nose. It also helps to drain fluid from the middle ear space. When the eustachian tube  does not function properly, air pressure, fluid, or both can build up in the middle ear. Eustachian tube dysfunction can affect one or both ears. What are the causes? This condition happens when the eustachian tube becomes blocked or cannot open normally. This may result from:  Ear infections.  Colds and other upper respiratory infections.  Allergies.  Irritation, such as from cigarette smoke or acid from the stomach coming up into the esophagus (gastroesophageal reflux).  Sudden changes in air pressure, such as from descending in an airplane.  Abnormal growths in the nose or throat,  such as nasal polyps, tumors, or enlarged tissue at the back of the throat (adenoids).  What increases the risk? This condition may be more likely to develop in people who smoke and people who are overweight. Eustachian tube dysfunction may also be more likely to develop in children, especially children who have:  Certain birth defects of the mouth, such as cleft palate.  Large tonsils and adenoids.  What are the signs or symptoms? Symptoms of this condition may include:  A feeling of fullness in the ear.  Ear pain.  Clicking or popping noises in the ear.  Ringing in the ear.  Hearing loss.  Loss of balance.  Symptoms may get worse when the air pressure around you changes, such as when you travel to an area of high elevation or fly on an airplane. How is this diagnosed? This condition may be diagnosed based on:  Your symptoms.  A physical exam of your ear, nose, and throat.  Tests, such as those that measure: ? The movement of your eardrum (tympanogram). ? Your hearing (audiometry).  How is this treated? Treatment depends on the cause and severity of your condition. If your symptoms are mild, you may be able to relieve your symptoms by moving air into ("popping") your ears. If you have symptoms of fluid in your ears, treatment may include:  Decongestants.  Antihistamines.  Nasal sprays or ear drops that contain medicines that reduce swelling (steroids).  In some cases, you may need to have a procedure to drain the fluid in your eardrum (myringotomy). In this procedure, a small tube is placed in the eardrum to:  Drain the fluid.  Restore the air in the middle ear space.  Follow these instructions at home:  Take over-the-counter and prescription medicines only as told by your health care provider.  Use techniques to help pop your ears as recommended by your health care provider. These may include: ? Chewing gum. ? Yawning. ? Frequent, forceful  swallowing. ? Closing your mouth, holding your nose closed, and gently blowing as if you are trying to blow air out of your nose.  Do not do any of the following until your health care provider approves: ? Travel to high altitudes. ? Fly in airplanes. ? Work in a Estate agent or room. ? Scuba dive.  Keep your ears dry. Dry your ears completely after showering or bathing.  Do not smoke.  Keep all follow-up visits as told by your health care provider. This is important. Contact a health care provider if:  Your symptoms do not go away after treatment.  Your symptoms come back after treatment.  You are unable to pop your ears.  You have: ? A fever. ? Pain in your ear. ? Pain in your head or neck. ? Fluid draining from your ear.  Your hearing suddenly changes.  You become very dizzy.  You lose your balance. This information is not  intended to replace advice given to you by your health care provider. Make sure you discuss any questions you have with your health care provider. Document Released: 05/27/2015 Document Revised: 10/06/2015 Document Reviewed: 05/19/2014 Elsevier Interactive Patient Education  [employer]2018 Elsevier Inc.

## 2018-07-07 ENCOUNTER — Encounter: Payer: No Typology Code available for payment source | Admitting: Medical

## 2018-07-15 ENCOUNTER — Ambulatory Visit (INDEPENDENT_AMBULATORY_CARE_PROVIDER_SITE_OTHER): Payer: No Typology Code available for payment source | Admitting: Medical

## 2018-07-15 ENCOUNTER — Encounter: Payer: Self-pay | Admitting: Medical

## 2018-07-15 ENCOUNTER — Ambulatory Visit (HOSPITAL_BASED_OUTPATIENT_CLINIC_OR_DEPARTMENT_OTHER)
Admission: RE | Admit: 2018-07-15 | Discharge: 2018-07-15 | Disposition: A | Discharge status: Home / Self Care | Payer: No Typology Code available for payment source | Source: Ambulatory Visit | Attending: Medical | Admitting: Medical

## 2018-07-15 VITALS — BP 108/59 | HR 57 | Temp 98.1°F | Resp 16 | Ht 67.0 in | Wt 142.2 lb

## 2018-07-15 DIAGNOSIS — L709 Acne, unspecified: Secondary | ICD-10-CM | POA: Diagnosis not present

## 2018-07-15 DIAGNOSIS — Z0000 Encounter for general adult medical examination without abnormal findings: Secondary | ICD-10-CM

## 2018-07-15 DIAGNOSIS — K5900 Constipation, unspecified: Secondary | ICD-10-CM

## 2018-07-15 DIAGNOSIS — R1013 Epigastric pain: Secondary | ICD-10-CM | POA: Insufficient documentation

## 2018-07-15 LAB — AMYLASE: AMYLASE: 96 U/L (ref 27–131)

## 2018-07-15 LAB — LIPASE: Lipase: 97 U/L — ABNORMAL HIGH (ref 11.0–59.0)

## 2018-07-15 MED ORDER — TRETINOIN 0.025 % EX CREA: TOPICAL_CREAM | CUTANEOUS | 0 refills | Status: DC

## 2018-07-15 MED ORDER — CLINDAMYCIN PHOSPHATE 1 % EX GEL: CUTANEOUS | 0 refills | Status: AC

## 2018-07-15 MED FILL — CLINDAMYCIN PH 1% GEL: 1 | 20 days supply | Qty: 30 | Fill #0

## 2018-07-15 NOTE — Patient Instructions (Addendum)
For you wellness exam today I have ordered cbc, cmp, and  lipid panel.  Vaccine up to date.  Recommend exercise and healthy diet.  We will let you know lab results as they come in.  Follow up date appointment will be determined after lab review.   For gi symptoms abd pain, bloated and mild constipation intermittent recommend healthy diet, probiotics, famotidine otc, hydrate well, regular exercise and will get basic lab but add amylase, lipase, h pylori test and get 1 view abdomen. May refer to GI if lab and these measures don't help.   Preventive Care 18-39 Years, Female Preventive care refers to lifestyle choices and visits with your health care provider that can promote health and wellness. What does preventive care include?   A yearly physical exam. This is also called an annual well check.  Dental exams once or twice a year.  Routine eye exams. Ask your health care provider how often you should have your eyes checked.  Personal lifestyle choices, including: ? Daily care of your teeth and gums. ? Regular physical activity. ? Eating a healthy diet. ? Avoiding tobacco and drug use. ? Limiting alcohol use. ? Practicing safe sex. ? Taking vitamin and mineral supplements as recommended by your health care provider. What happens during an annual well check? The services and screenings done by your health care provider during your annual well check will depend on your age, overall health, lifestyle risk factors, and family history of disease. Counseling Your health care provider may ask you questions about your:  Alcohol use.  Tobacco use.  Drug use.  Emotional well-being.  Home and relationship well-being.  Sexual activity.  Eating habits.  Work and work Statistician.  Method of birth control.  Menstrual cycle.  Pregnancy history. Screening You may have the following tests or measurements:  Height, weight, and BMI.  Diabetes screening. This is done by  checking your blood sugar (glucose) after you have not eaten for a while (fasting).  Blood pressure.  Lipid and cholesterol levels. These may be checked every 5 years starting at age 74.  Skin check.  Hepatitis C blood test.  Hepatitis B blood test.  Sexually transmitted disease (STD) testing.  BRCA-related cancer screening. This may be done if you have a family history of breast, ovarian, tubal, or peritoneal cancers.  Pelvic exam and Pap test. This may be done every 3 years starting at age 5. Starting at age 62, this may be done every 5 years if you have a Pap test in combination with an HPV test. Discuss your test results, treatment options, and if necessary, the need for more tests with your health care provider. Vaccines Your health care provider may recommend certain vaccines, such as:  Influenza vaccine. This is recommended every year.  Tetanus, diphtheria, and acellular pertussis (Tdap, Td) vaccine. You may need a Td booster every 10 years.  Varicella vaccine. You may need this if you have not been vaccinated.  HPV vaccine. If you are 73 or younger, you may need three doses over 6 months.  Measles, mumps, and rubella (MMR) vaccine. You may need at least one dose of MMR. You may also need a second dose.  Pneumococcal 13-valent conjugate (PCV13) vaccine. You may need this if you have certain conditions and were not previously vaccinated.  Pneumococcal polysaccharide (PPSV23) vaccine. You may need one or two doses if you smoke cigarettes or if you have certain conditions.  Meningococcal vaccine. One dose is recommended if you are  age 38-21 years and a first-year college student living in a residence hall, or if you have one of several medical conditions. You may also need additional booster doses.  Hepatitis A vaccine. You may need this if you have certain conditions or if you travel or work in places where you may be exposed to hepatitis A.  Hepatitis B vaccine. You may  need this if you have certain conditions or if you travel or work in places where you may be exposed to hepatitis B.  Haemophilus influenzae type b (Hib) vaccine. You may need this if you have certain risk factors. Talk to your health care provider about which screenings and vaccines you need and how often you need them. This information is not intended to replace advice given to you by your health care provider. Make sure you discuss any questions you have with your health care provider. Document Released: 06/26/2001 Document Revised: 12/11/2016 Document Reviewed: 03/01/2015 Elsevier Interactive Patient Education  2019 [employer].

## 2018-07-15 NOTE — Progress Notes (Signed)
Subjective:    Patient ID: Stacy Saunders, female    DOB: 02-Jun-1985, 33 y.o.   MRN: [id number]  HPI  Pt in for cpe/wellness. She is fasting.    Pt works as Engineer, civil (consulting). She is not exercising. Not drinking caffeine beverage. Alcohol social on weekends. Nonsmoker.  LMP- Jun 26, 2018.  Papsmear- appointment today to get one done. Last one 2018 and was normal.  See hpi. On abdomen pain.  Review of Systems  Constitutional: Negative for chills, fatigue and fever.  HENT: Negative for congestion, ear discharge, ear pain, postnasal drip, rhinorrhea and sinus pain.   Respiratory: Negative for cough, chest tightness, shortness of breath and wheezing.   Cardiovascular: Negative for chest pain and palpitations.  Gastrointestinal: Negative for abdominal distention, anal bleeding, constipation, nausea and vomiting.       Pt states recent intermittent abdomen pain. She states pain after she eats. Epigastric area pain at times. Pt states pain will occur randomly. Pain for past 6 months. Happens 1-2 time over 2 weeks. Can last up to minutes to 2 hours after she eats. nauseu and bloating at time. LMP was normal and expected time. Last bm today.   Musculoskeletal: Negative for back pain and gait problem.  Skin: Negative for rash.       Hx of acne. Pt had rx's filled today but written orignially by dermatologist. Now that derm out of network.  Neurological: Negative for dizziness, seizures, light-headedness and numbness.  Hematological: Negative for adenopathy. Does not bruise/bleed easily.  Psychiatric/Behavioral: Negative for behavioral problems, decreased concentration and suicidal ideas. The patient is not nervous/anxious and is not hyperactive.    Past Medical History:  Diagnosis Date  . Asthma    exercise induced  . UTI (lower urinary tract infection)      Social History   Socioeconomic History  . Marital status: Single    Spouse name: Not on file  . Number of children: Not on file  . Years  of education: Not on file  . Highest education level: Not on file  Occupational History  . Not on file  Social Needs  . Financial resource strain: Not on file  . Food insecurity:    Worry: Not on file    Inability: Not on file  . Transportation needs:    Medical: Not on file    Non-medical: Not on file  Tobacco Use  . Smoking status: Never Smoker  . Smokeless tobacco: Never Used  Substance and Sexual Activity  . Alcohol use: Yes    Comment: social  . Drug use: No  . Sexual activity: Yes  Lifestyle  . Physical activity:    Days per week: Not on file    Minutes per session: Not on file  . Stress: Not on file  Relationships  . Social connections:    Talks on phone: Not on file    Gets together: Not on file    Attends religious service: Not on file    Active member of club or organization: Not on file    Attends meetings of clubs or organizations: Not on file    Relationship status: Not on file  . Intimate partner violence:    Fear of current or ex partner: Not on file    Emotionally abused: Not on file    Physically abused: Not on file    Forced sexual activity: Not on file  Other Topics Concern  . Not on file  Social History Narrative  .  Not on file    No past surgical history on file.  Family History  Problem Relation Age of Onset  . Hypertension Mother   . Hyperlipidemia Mother   . Glaucoma Father   . Aneurysm Maternal Grandmother     No Known Allergies  Current Outpatient Medications on File Prior to Visit  Medication Sig Dispense Refill  . Multiple Vitamin (MULTIVITAMIN) tablet Take 1 tablet by mouth daily.     No current facility-administered medications on file prior to visit.     BP (!) 108/59   Pulse (!) 57   Temp 98.1 F (36.7 C) (Oral)   Resp 16   Ht 5\' 7"  (1.702 m)   Wt 142 lb 3.2 oz (64.5 kg)   SpO2 100%   BMI 22.27 kg/m       Objective:   Physical Exam  General Mental Status- Alert. General Appearance- Not in acute distress.     Skin General: Color- Normal Color. Moisture- Normal Moisture.  Neck Carotid Arteries- Normal color. Moisture- Normal Moisture. No carotid bruits. No JVD.  Chest and Lung Exam Auscultation: Breath Sounds:-Normal.  Cardiovascular Auscultation:Rythm- Regular. Murmurs & Other Heart Sounds:Auscultation of the heart reveals- No Murmurs.  Abdomen Inspection:-Inspeection Normal. Palpation/Percussion:Note:No mass. Palpation and Percussion of the abdomen reveal- Non Tender, Non Distended + BS, no rebound or guarding.   Neurologic Cranial Nerve exam:- CN III-XII intact(No nystagmus), symmetric smile. Strength:- 5/5 equal and symmetric strength both upper and lower extremities.      Assessment & Plan:  For you wellness exam today I have ordered cbc, cmp, and  lipid panel.  Vaccine up to date.  Recommend exercise and healthy diet.  We will let you know lab results as they come in.  Follow up date appointment will be determined after lab review.  For gi symptoms abd pain, bloated and mild constipation intermittent recommend healthy diet, probiotics, famotidine otc, hydrate well, regular exercise and will get basic lab but add amylase, lipase, h pylori test and get 1 view abdomen. May refer to GI if lab and these measures don't help  [phone] added to visit for wellness. See plan formulated as discussed above.    Esperanza Richters, PA-C

## 2018-07-16 LAB — H. PYLORI BREATH TEST: H. pylori Breath Test: NOT DETECTED

## 2018-07-17 ENCOUNTER — Encounter: Payer: Self-pay | Admitting: Medical

## 2018-07-18 NOTE — Addendum Note (Signed)
Addended by: Gwenevere Abbot on: 07/18/2018 08:04 AM   Modules accepted: Orders

## 2018-07-21 ENCOUNTER — Other Ambulatory Visit: Payer: No Typology Code available for payment source

## 2018-07-22 ENCOUNTER — Telehealth: Payer: Self-pay

## 2018-07-22 ENCOUNTER — Telehealth: Payer: Self-pay | Admitting: Medical

## 2018-07-22 DIAGNOSIS — L709 Acne, unspecified: Secondary | ICD-10-CM

## 2018-07-22 NOTE — Telephone Encounter (Signed)
Pt wants to be referred to [facility] dermatologist. Phone number is [phone]. Pt contact number is [phone].

## 2018-07-22 NOTE — Telephone Encounter (Signed)
Copied from CRM [id number]. Topic: Referral - Status >> Jul 18, 2018  3:26 PM Baldo Daub L wrote: Reason for CRM:   Pt states she was supposed to call back with the name of the dermatologist that she wanted to see. Pt would like to see [facility]: [address]; [place], [place] [address] - phone: [phone] Pt can be reached at [phone]

## 2018-07-22 NOTE — Telephone Encounter (Signed)
New derm referral placed

## 2018-07-25 ENCOUNTER — Other Ambulatory Visit: Payer: No Typology Code available for payment source

## 2018-08-11 ENCOUNTER — Telehealth: Payer: Self-pay

## 2018-08-11 NOTE — Telephone Encounter (Signed)
PA initiated via Covermymeds; KEY: [id number]. Awaiting determination.

## 2018-08-13 NOTE — Telephone Encounter (Signed)
PA approved.   The request has been approved. The authorization is effective for a maximum of 12 fills from 08/09/2018 to 08/08/2019, as long as the member is enrolled in their current health plan. The request was approved as submitted. A written notification letter will follow with additional details

## 2019-02-04 ENCOUNTER — Other Ambulatory Visit: Payer: Self-pay

## 2019-02-04 ENCOUNTER — Other Ambulatory Visit (INDEPENDENT_AMBULATORY_CARE_PROVIDER_SITE_OTHER): Payer: No Typology Code available for payment source

## 2019-02-04 ENCOUNTER — Ambulatory Visit (INDEPENDENT_AMBULATORY_CARE_PROVIDER_SITE_OTHER): Payer: No Typology Code available for payment source | Admitting: *Deleted

## 2019-02-04 DIAGNOSIS — Z0000 Encounter for general adult medical examination without abnormal findings: Secondary | ICD-10-CM

## 2019-02-04 DIAGNOSIS — Z111 Encounter for screening for respiratory tuberculosis: Secondary | ICD-10-CM

## 2019-02-04 LAB — CBC WITH DIFFERENTIAL/PLATELET
Basophils Absolute: 0 10*3/uL (ref 0.0–0.1)
Basophils Relative: 0.7 % (ref 0.0–3.0)
Eosinophils Absolute: 0.1 10*3/uL (ref 0.0–0.7)
Eosinophils Relative: 1.1 % (ref 0.0–5.0)
HCT: 41 % (ref 36.0–46.0)
Hemoglobin: 13.6 g/dL (ref 12.0–15.0)
Lymphocytes Relative: 40 % (ref 12.0–46.0)
Lymphs Abs: 2.1 10*3/uL (ref 0.7–4.0)
MCHC: 33.3 g/dL (ref 30.0–36.0)
MCV: 93.5 fl (ref 78.0–100.0)
Monocytes Absolute: 0.6 10*3/uL (ref 0.1–1.0)
Monocytes Relative: 11.1 % (ref 3.0–12.0)
Neutro Abs: 2.5 10*3/uL (ref 1.4–7.7)
Neutrophils Relative %: 47.1 % (ref 43.0–77.0)
Platelets: 213 10*3/uL (ref 150.0–400.0)
RBC: 4.38 Mil/uL (ref 3.87–5.11)
RDW: 13 % (ref 11.5–15.5)
WBC: 5.3 10*3/uL (ref 4.0–10.5)

## 2019-02-04 LAB — COMPREHENSIVE METABOLIC PANEL
ALT: 63 U/L — ABNORMAL HIGH (ref 0–35)
AST: 96 U/L — ABNORMAL HIGH (ref 0–37)
Albumin: 4.3 g/dL (ref 3.5–5.2)
Alkaline Phosphatase: 49 U/L (ref 39–117)
BUN: 12 mg/dL (ref 6–23)
CO2: 26 mEq/L (ref 19–32)
Calcium: 9.7 mg/dL (ref 8.4–10.5)
Chloride: 101 mEq/L (ref 96–112)
Creatinine, Ser: 0.71 mg/dL (ref 0.40–1.20)
GFR: 116.13 mL/min (ref >60.00)
Glucose, Bld: 85 mg/dL (ref 70–99)
Potassium: 4 mEq/L (ref 3.5–5.1)
Sodium: 135 mEq/L (ref 135–145)
Total Bilirubin: 0.4 mg/dL (ref 0.2–1.2)
Total Protein: 6.9 g/dL (ref 6.0–8.3)

## 2019-02-04 LAB — LIPID PANEL
Cholesterol: 125 mg/dL (ref 0–200)
HDL: 59 mg/dL (ref >39.00)
LDL Cholesterol: 61 mg/dL (ref 0–99)
NonHDL: 66.19
Total CHOL/HDL Ratio: 2
Triglycerides: 25 mg/dL (ref 0.0–149.0)
VLDL: 5 mg/dL (ref 0.0–40.0)

## 2019-02-04 NOTE — Progress Notes (Signed)
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  Never had a positive tb.  TB given in right arm at 1054am.  Patient to come back on 02/06/19 after 1054am.

## 2019-02-06 LAB — TB SKIN TEST
Induration: 0 mm
TB Skin Test: NEGATIVE

## 2019-02-07 ENCOUNTER — Encounter: Payer: Self-pay | Admitting: Medical

## 2019-02-09 ENCOUNTER — Telehealth: Payer: Self-pay | Admitting: Medical

## 2019-02-09 DIAGNOSIS — R109 Unspecified abdominal pain: Secondary | ICD-10-CM

## 2019-02-09 NOTE — Telephone Encounter (Signed)
GI referral placed

## 2019-02-10 ENCOUNTER — Encounter: Payer: Self-pay | Admitting: Gastroenterology

## 2019-02-12 ENCOUNTER — Ambulatory Visit: Payer: No Typology Code available for payment source | Admitting: Gastroenterology

## 2019-02-12 ENCOUNTER — Encounter: Payer: Self-pay | Admitting: Medical

## 2019-02-12 ENCOUNTER — Ambulatory Visit (HOSPITAL_BASED_OUTPATIENT_CLINIC_OR_DEPARTMENT_OTHER)
Admission: RE | Admit: 2019-02-12 | Discharge: 2019-02-12 | Disposition: A | Discharge status: Home / Self Care | Payer: No Typology Code available for payment source | Source: Ambulatory Visit | Attending: Medical | Admitting: Medical

## 2019-02-12 ENCOUNTER — Other Ambulatory Visit: Payer: Self-pay

## 2019-02-12 DIAGNOSIS — R109 Unspecified abdominal pain: Secondary | ICD-10-CM | POA: Insufficient documentation

## 2019-02-25 ENCOUNTER — Ambulatory Visit: Payer: No Typology Code available for payment source | Admitting: Gastroenterology

## 2019-03-04 ENCOUNTER — Other Ambulatory Visit: Payer: Self-pay

## 2019-03-04 ENCOUNTER — Ambulatory Visit (INDEPENDENT_AMBULATORY_CARE_PROVIDER_SITE_OTHER): Payer: No Typology Code available for payment source | Admitting: Gastroenterology

## 2019-03-04 ENCOUNTER — Other Ambulatory Visit (INDEPENDENT_AMBULATORY_CARE_PROVIDER_SITE_OTHER): Payer: No Typology Code available for payment source

## 2019-03-04 ENCOUNTER — Encounter: Payer: Self-pay | Admitting: Gastroenterology

## 2019-03-04 VITALS — BP 106/60 | HR 69 | Temp 98.0°F | Ht 67.0 in | Wt 138.0 lb

## 2019-03-04 DIAGNOSIS — R748 Abnormal levels of other serum enzymes: Secondary | ICD-10-CM

## 2019-03-04 DIAGNOSIS — R1013 Epigastric pain: Secondary | ICD-10-CM

## 2019-03-04 DIAGNOSIS — R12 Heartburn: Secondary | ICD-10-CM

## 2019-03-04 DIAGNOSIS — R7989 Other specified abnormal findings of blood chemistry: Secondary | ICD-10-CM | POA: Diagnosis not present

## 2019-03-04 DIAGNOSIS — R195 Other fecal abnormalities: Secondary | ICD-10-CM

## 2019-03-04 DIAGNOSIS — E559 Vitamin D deficiency, unspecified: Secondary | ICD-10-CM

## 2019-03-04 LAB — VITAMIN D 25 HYDROXY (VIT D DEFICIENCY, FRACTURES): VITD: 14.34 ng/mL — ABNORMAL LOW (ref 30.00–100.00)

## 2019-03-04 LAB — HEPATIC FUNCTION PANEL
ALT: 9 U/L (ref 0–35)
AST: 15 U/L (ref 0–37)
Albumin: 4.1 g/dL (ref 3.5–5.2)
Alkaline Phosphatase: 48 U/L (ref 39–117)
Bilirubin, Direct: 0.1 mg/dL (ref 0.0–0.3)
Total Bilirubin: 0.4 mg/dL (ref 0.2–1.2)
Total Protein: 6.7 g/dL (ref 6.0–8.3)

## 2019-03-04 LAB — LIPASE: Lipase: 45 U/L (ref 11.0–59.0)

## 2019-03-04 NOTE — Patient Instructions (Addendum)
If you are age 31 or older, your body mass index should be between 23-30. Your Body mass index is 21.61 kg/m. If this is out of the aforementioned range listed, please consider follow up with your Primary Care Provider.  If you are age 59 or younger, your body mass index should be between 19-25. Your Body mass index is 21.61 kg/m. If this is out of the aformentioned range listed, please consider follow up with your Primary Care Provider.   To help prevent the possible spread of infection to our patients, communities, and staff; we will be implementing the following measures:  As of now we are not allowing any visitors/family members to accompany you to any upcoming appointments with [facility]. If you have any concerns about this please contact our office to discuss prior to the appointment.   Your provider has requested that you go to the basement level for lab work at our [place] location ([address]. [place] [address]) . Press "B" on the elevator. The lab is located at the first door on the left as you exit the elevator. You may go at whatever time is convienent for you. The current hours of operations are Monday- Friday 7:30am-4:30pm.  It has been recommended to you by your physician that you have a(n) EGD/Colonoscopy completed. Per your request, we did not schedule the procedure(s) today. Please contact our office at [phone] should you decide to have the procedure completed. You will be scheduled for a pre-visit and procedure at that time.  It was a pleasure to see you today!  Vito Cirigliano, D.O.

## 2019-03-04 NOTE — Progress Notes (Signed)
Chief Complaint: Elevated LAEs, abdominal pain, change in BM, nausea without emesis  Referring Provider:     Esperanza Richters, PA-C   HPI:    Stacy Saunders is a 33 y.o. female referred [facility] for evaluation of multiple GI issues:  1) Elevated LAEs: Newly elevated AST/ALT foound on recent routine labs, with normal ALP, TBili, Albumin. Otherwise normal CBC and BMP.  Previously normal LAEs. Was taking apple cider vinegar vitamins (for MEG pain), but o/w no new medications, supplements, OTCs, etc. no prior known personal or family history of liver disease.  No previous tattoos, IVDU/IVDA, blood transfusions.  Drinks 2 glasses wine/week. No history of jaundice, icteric sclera, ascites.  AST/ALT: - 02/04/19: 96/63 - 07/12/17: 14/9  - RUQ [place] (02/12/19): Normal   2) MEG pain:  Has had intemrmittent, sharp MEG/LUQ pain and nausea without emesis. Pain does not radiate. Typically 30 mins post prandial, independent of food types. Started approx 2 years ago, bt now more frequent, 2-3 times/week. Lasts a few hours. No fever, chills, early satiety, sitophobia.  Other than apple cider vinegar, has not trialed any meds for this. Stopped eating pork/beef for 3 years as a lifestyle decision, not d/t sxs. No other dietary mods. Weight stable.   - Lipase 97 in 07/2018. Normal amylase and negative H pylori breath testing.  No cross-sectional imaging.  No repeat lipase for review.  3) Change in bowel habits:  Has a longstanding history of chronic constipation, described as 1 solid stool every 3 to 4 days.  Now with daily soft or loose, watery, non-bloody stool. 1 BM/day. No nocturnal BM. Occasional lower abdominal cramping, improved with BM. No hematochezia.  Has not trialed any medications for this.  4) Heartburn: Present for a few years, occurring intermittently. Does not take any meds for this. Occurs typically eating close to bedtime. No dysphagia.   No prior EGD or  colonoscopy.   Cousin with Crohns and great aunt with CRC. Otherwise, no known family history of CRC, GI malignancy, liver disease, pancreatic disease, or IBD.   Past Medical History:  Diagnosis Date  . Asthma    exercise induced  . Lactose intolerance   . UTI (lower urinary tract infection)      Past Surgical History:  Procedure Laterality Date  . WISDOM TOOTH EXTRACTION     just one they pulled   Family History  Problem Relation Age of Onset  . Hypertension Mother   . Hyperlipidemia Mother   . Glaucoma Father   . Aneurysm Maternal Grandmother   . Colon cancer Maternal Aunt        Great aunt   . Crohn's disease Cousin        On maternal side  . Esophageal cancer Neg Hx    Social History   Tobacco Use  . Smoking status: Never Smoker  . Smokeless tobacco: Never Used  Substance Use Topics  . Alcohol use: Yes    Comment: social  . Drug use: No   Current Outpatient Medications  Medication Sig Dispense Refill  . clindamycin (CLINDAGEL) 1 % gel Apply a small amount to your entire face every morning 30 g 0  . Multiple Vitamin (MULTIVITAMIN) tablet Take 1 tablet by mouth daily.    Marland Kitchen tretinoin (RETIN-A) 0.025 % cream Apply a pea-sized amount every night 45 g 0   No current facility-administered medications for this visit.    No  Known Allergies   Review of Systems: All systems reviewed and negative except where noted in HPI.     Physical Exam:    Wt Readings from Last 3 Encounters:  03/04/19 138 lb (62.6 kg)  07/15/18 142 lb 3.2 oz (64.5 kg)  12/25/17 135 lb 12.8 oz (61.6 kg)    BP 106/60   Pulse 69   Temp 98 F (36.7 C)   Ht 5\' 7"  (1.702 m)   Wt 138 lb (62.6 kg)   BMI 21.61 kg/m  Constitutional:  Pleasant, in no acute distress. Psychiatric: Normal mood and affect. Behavior is normal. EENT: Pupils normal.  Conjunctivae are normal. No scleral icterus. Neck supple. No cervical LAD. Cardiovascular: Normal rate, regular rhythm. No edema Pulmonary/chest:  Effort normal and breath sounds normal. No wheezing, rales or rhonchi. Abdominal: Soft, nondistended, nontender. Bowel sounds active throughout. There are no masses palpable. No hepatomegaly. Neurological: Alert and oriented to person place and time. Skin: Skin is warm and dry. No rashes noted.   ASSESSMENT AND PLAN;   1) Elevated AST/ALT 2) Elevated lipase Discussed the very broad DDX for incidentally noted elevated liver enzymes.  RUQ [place] was otherwise unremarkable.  Does have a history of epigastric pain and elevated lipase in 07/2018.  Proceed as below:  - Repeat LAEs - Repeat lipase -If still elevated enzymes and remainder of work-up otherwise unrevealing, plan for extended serologic work-up and possible MRI of liver and pancreas  3) Epigastric pain - EGD with gastric biopsies  4) Change in bowel habits - Colonoscopy with random and directed biopsies.  Duodenal biopsies at time of EGD as above -Recommend fiber supplement -Continue adequate daily hydration  5) Heartburn: -Evaluate for erosive esophagitis, LES laxity, hiatal hernia time of EGD as above -Briefly discussed trial of PPI.  Would prefer endoscopic evaluation first  6) History of vitamin D deficiency: - Check Vit D per patient request  The indications, risks, and benefits of EGD were explained to the patient in detail. Risks include but are not limited to bleeding, perforation, adverse reaction to medications, and cardiopulmonary compromise. Sequelae include but are not limited to the possibility of surgery, hositalization, and mortality. The patient verbalized understanding and wished to proceed. All questions answered, referred to scheduler. Further recommendations pending results of the exam.     Lavena Bullion, DO, FACG  03/04/2019, 2:16 PM   Saguier, Percell Miller, PA-C

## 2019-03-06 ENCOUNTER — Other Ambulatory Visit: Payer: Self-pay

## 2019-03-06 ENCOUNTER — Telehealth: Payer: Self-pay | Admitting: Gastroenterology

## 2019-03-06 DIAGNOSIS — E559 Vitamin D deficiency, unspecified: Secondary | ICD-10-CM

## 2019-03-06 MED ORDER — VITAMIN D (ERGOCALCIFEROL) 1.25 MG (50000 UNIT) PO CAPS: 50000.0000 [IU] | ORAL_CAPSULE | ORAL | 0 refills | Status: AC

## 2019-03-06 NOTE — Telephone Encounter (Signed)
Please see additional documentation concerning this patient 

## 2019-03-06 NOTE — Telephone Encounter (Signed)
Pt reported that she had labs done 03/04/19 and would like recommendations on what to do for low vitamin D.

## 2019-06-08 ENCOUNTER — Other Ambulatory Visit: Payer: Self-pay

## 2019-06-08 ENCOUNTER — Telehealth: Payer: Self-pay

## 2019-06-08 ENCOUNTER — Other Ambulatory Visit: Payer: Self-pay | Admitting: Gastroenterology

## 2019-06-08 NOTE — Progress Notes (Signed)
cbc

## 2019-06-08 NOTE — Telephone Encounter (Signed)
Left message for patient to call back to the office;  

## 2019-06-08 NOTE — Telephone Encounter (Signed)
-----   Message from Johnney Killian, RN sent at 03/06/2019  3:14 PM EDT ----- Regarding: FW: lab recall  ----- Message ----- From: Johnney Killian, RN Sent: 03/06/2019   2:59 PM EDT To: Rueben Bash, CMA Subject: lab recall                                     Please notify the patient that she is needing to complete repeat vitamin D level in 3 months (from 03/06/2019)-around 06/06/2019; thank you

## 2019-06-09 NOTE — Telephone Encounter (Signed)
Left message for patient to call back to the office;  

## 2019-06-10 NOTE — Telephone Encounter (Signed)
Left message for patient to call back to the office;  

## 2019-06-11 NOTE — Telephone Encounter (Signed)
Called and spoke with patient-patient reports she is in the process of getting new insurance and will complete the lab work once she has received her new insurance card (so she can bring it with her to be entered into Epic);   Patient advised to call back to the office at [phone] should questions/concerns arise;  Patient verbalized understanding of information/instructions;

## 2019-08-19 ENCOUNTER — Telehealth: Payer: Self-pay | Admitting: Medical

## 2019-08-19 MED ORDER — TRETINOIN 0.025 % EX CREA: TOPICAL_CREAM | CUTANEOUS | 0 refills | Status: DC

## 2019-08-19 NOTE — Telephone Encounter (Signed)
Medication sent.

## 2019-08-19 NOTE — Telephone Encounter (Signed)
Medication: tretinoin (RETIN-A) 0.025 % cream [[id number]]    Has the patient contacted their pharmacy? No. (If no, request that the patient contact the pharmacy for the refill.) (If yes, when an  d what did the pharmacy advise?)Preferred Pharmacy (with phone number or street name): [facility] - [place], [place] - [address]  [address] [place], [place] [address]  Phone:  [phone] Fax:  [phone]  DEA #:  --  Agent: Please be advised that RX refills may take up to 3 business days. We ask that you follow-up with your pharmacy.

## 2019-08-21 ENCOUNTER — Telehealth: Payer: Self-pay

## 2019-08-21 DIAGNOSIS — E559 Vitamin D deficiency, unspecified: Secondary | ICD-10-CM

## 2019-08-21 NOTE — Telephone Encounter (Signed)
Pt called stating the refill that was sent in for tretinoin (RETIN-A) 0.025 % cream was sent in to [facility] and it needs to be sent to [facility]. [address], [place], [place].  It also needs to be changed to 0.05 per the pt because she stated that is what the original strength the script was written for.

## 2019-08-21 NOTE — Telephone Encounter (Signed)
Patient has been being prescribed .0025 but states her dermatologist prescribed her .005 around 2018  and wants that the original script sent back in.

## 2019-08-22 ENCOUNTER — Telehealth: Payer: Self-pay | Admitting: Medical

## 2019-08-22 MED ORDER — TRETINOIN 0.05 % EX CREA: TOPICAL_CREAM | Freq: Every day | CUTANEOUS | 0 refills | Status: DC

## 2019-08-22 NOTE — Telephone Encounter (Signed)
I sent in requested percentage rx. She needs to be reminded this is category C medication in event late menses or pregnancy. Has not been studied.   I have not seen her in a year. No further refills unless she come in for appointment. I have been filling for her but she had dermatologist who originally rx'd.  Category C not proven safe during pregancy. Want her to be advised as this is what I would have told her had she been in.

## 2019-08-22 NOTE — Telephone Encounter (Signed)
Retin-a rx sent to pharmacy.

## 2019-08-24 NOTE — Telephone Encounter (Signed)
Pt would like for this  tretinoin (RETIN-A) 0.05 % cream to be sent to  [facility] instead of [id number] - [place], [place] - [address] AT [facility] [address] Phone:  [phone]  Fax:  [phone]

## 2019-08-24 NOTE — Telephone Encounter (Signed)
please refill to correct pharmacy, I canceled the order to [facility].

## 2019-08-25 ENCOUNTER — Telehealth: Payer: Self-pay | Admitting: Medical

## 2019-08-25 MED ORDER — TRETINOIN 0.05 % EX CREA: TOPICAL_CREAM | Freq: Every day | CUTANEOUS | 0 refills | Status: AC

## 2019-08-25 NOTE — Telephone Encounter (Signed)
Sent Retin-A to pt pharmacy.

## 2019-08-25 NOTE — Telephone Encounter (Signed)
Sent Rx to correct pharmacy

## 2020-11-13 IMAGING — US US ABDOMEN COMPLETE
1 series · 14 of 25 positions shown · non-contrast
Comparison: None.

CLINICAL DATA: Epigastric pain for several months with elevated
LFTs

EXAM:
ABDOMEN ULTRASOUND COMPLETE

[Series 1: us abdomen complete · 14 of 101 slices shown]
[im 1/101]
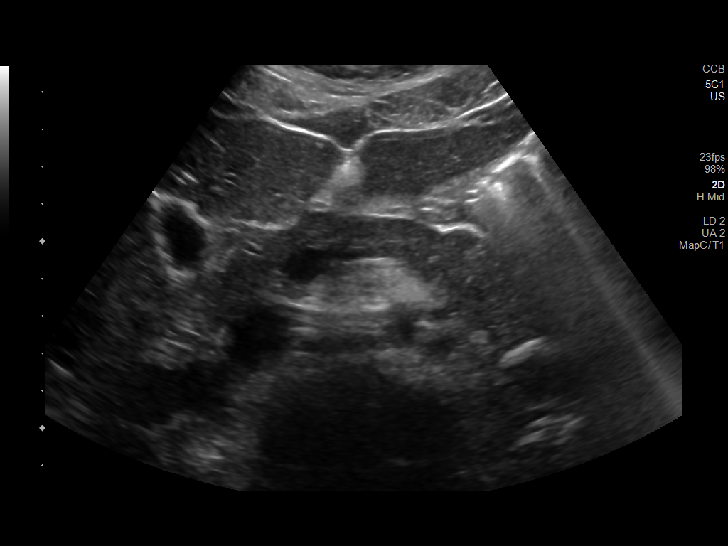
[im 9/101]
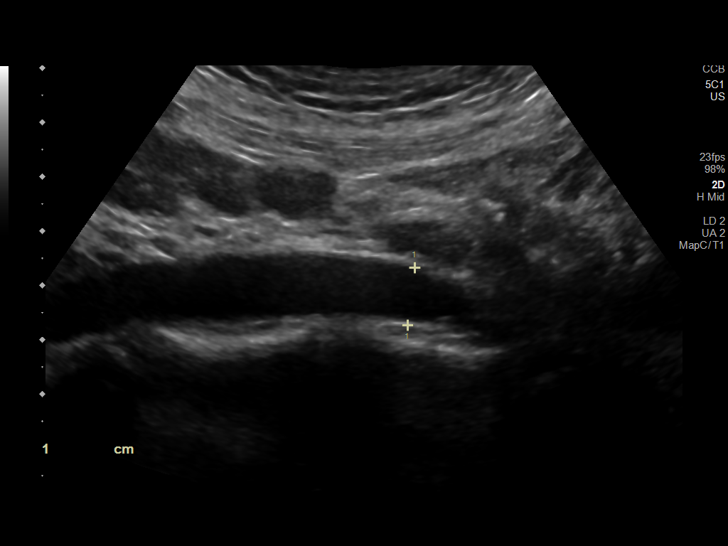
[im 17/101]
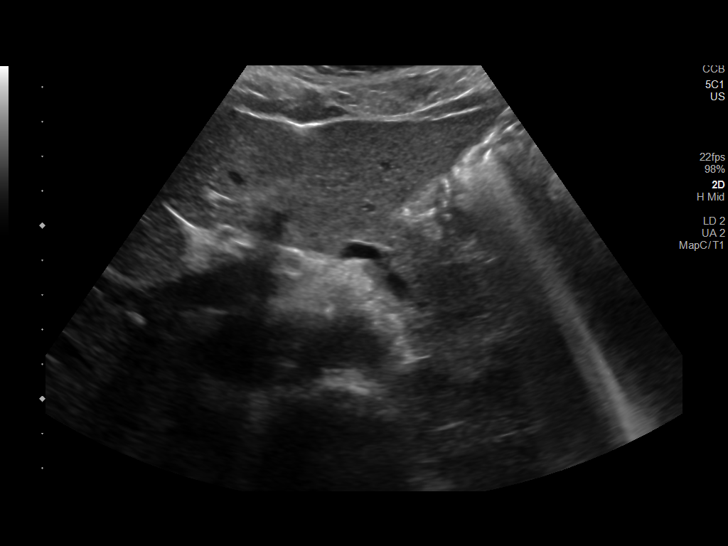
[im 26/101]
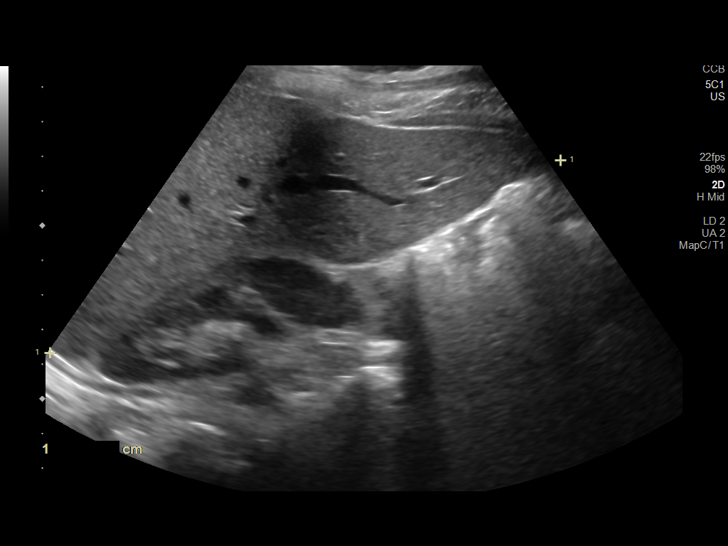
[im 34/101]
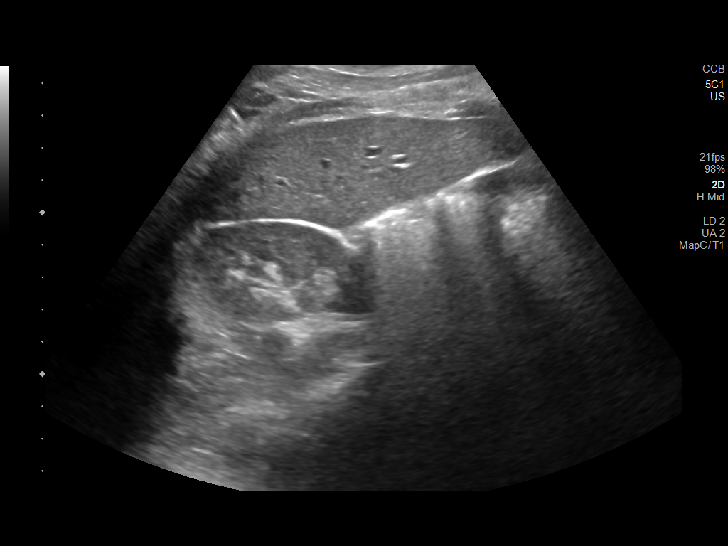
[im 38/101]
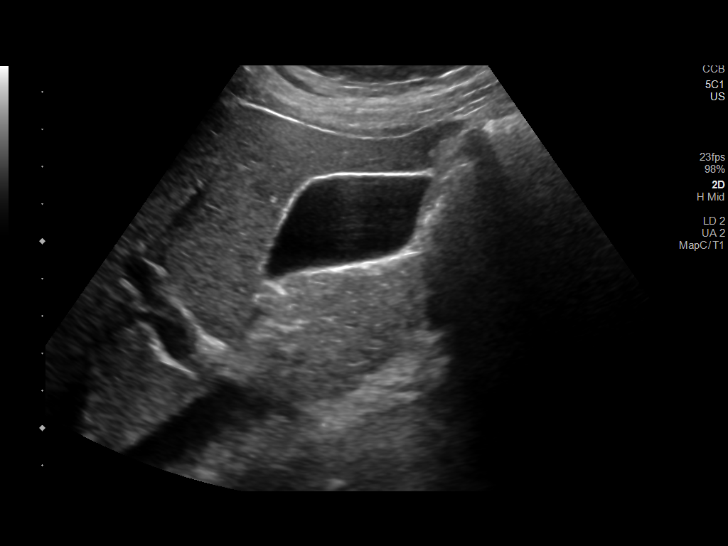
[im 46/101]
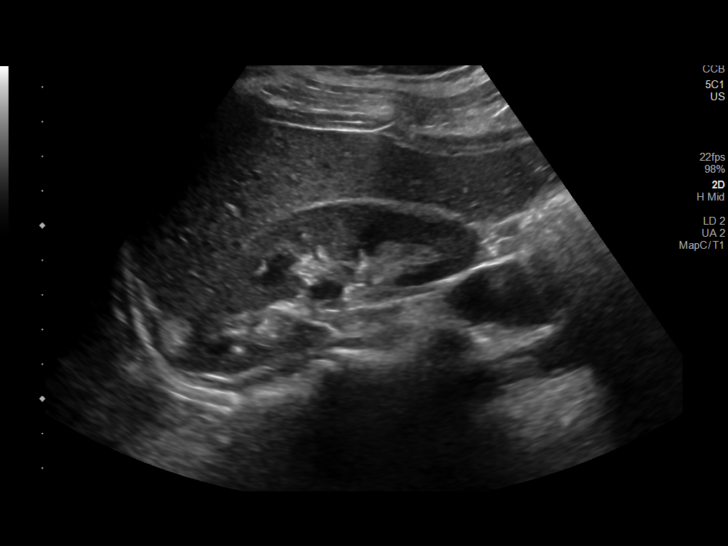
[im 55/101]
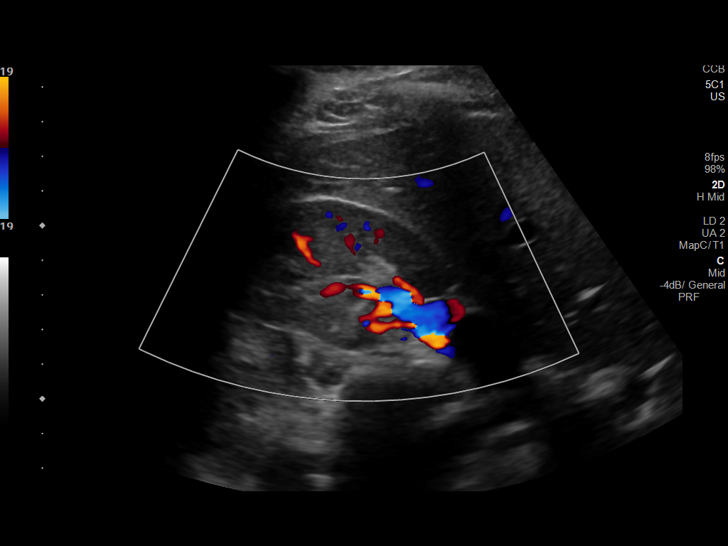
[im 63/101]
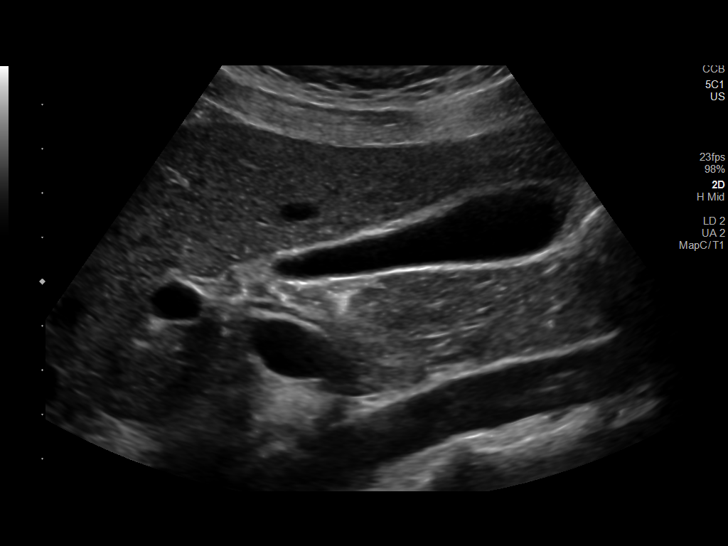
[im 67/101]
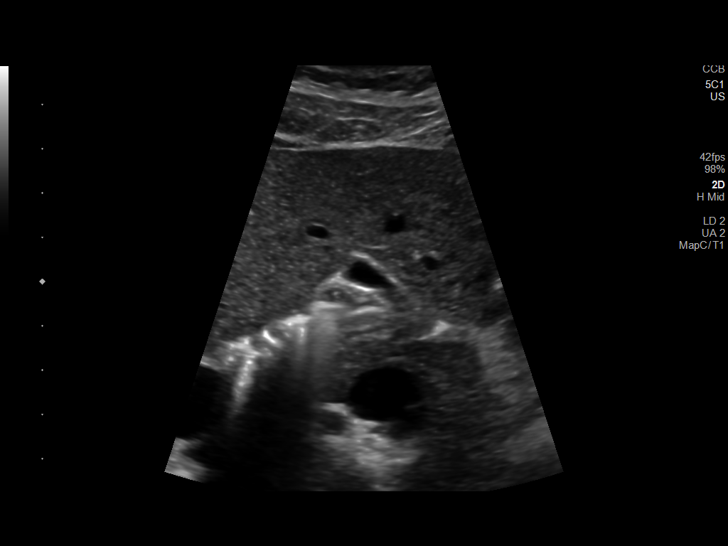
[im 76/101]
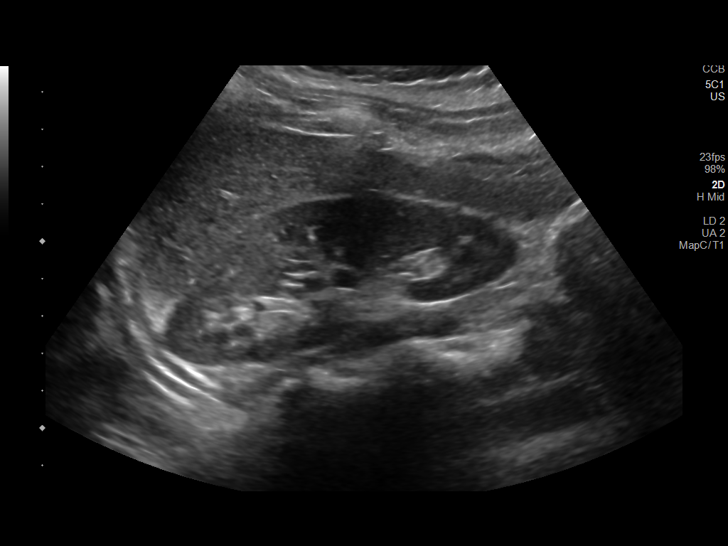
[im 84/101]
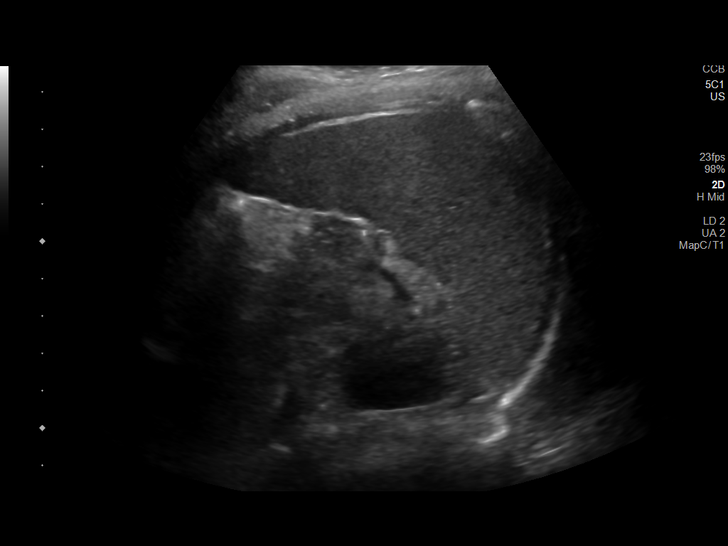
[im 92/101]
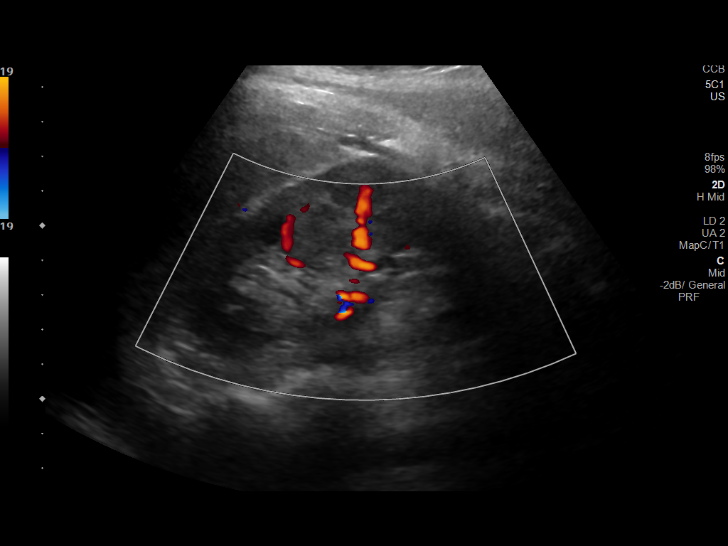
[im 101/101]
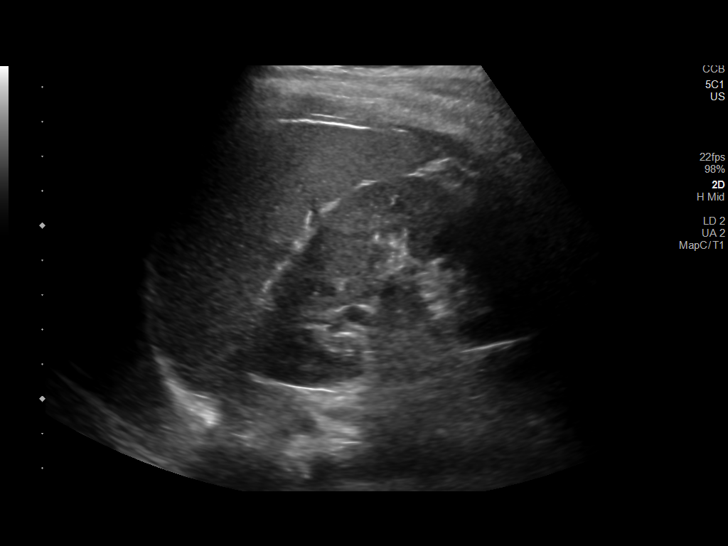

[14 of 25 positions shown; findings below may reference images not displayed]

FINDINGS: Gallbladder: No gallstones or wall thickening visualized. No
sonographic Murphy sign noted by sonographer.

Common bile duct: Diameter: 2.8 mm.

Liver: No focal mass lesion is noted. Normal echogenicity is noted.
Prominent Riedel's lobe is noted on the right. Portal vein is patent
on color Doppler imaging with normal direction of blood flow towards
the liver.

IVC: No abnormality visualized.

Pancreas: Visualized portion unremarkable.

Spleen: Size and appearance within normal limits.

Right Kidney: Length: 9.8 cm. Echogenicity within normal limits. No
mass or hydronephrosis visualized.

Left Kidney: Length: 10.1 cm. Echogenicity within normal limits. No
mass or hydronephrosis visualized.

Abdominal aorta: No aneurysm visualized.

Other findings: None.
IMPRESSION: No acute abnormality noted.
